# Patient Record
Sex: Female | Born: 1975
Health system: Southern US, Community
[De-identification: ages and names within clinical notes are randomized; demographics above are authoritative.]

## PROBLEM LIST (undated history)

## (undated) DIAGNOSIS — Z8489 Family history of other specified conditions: Secondary | ICD-10-CM

## (undated) DIAGNOSIS — D219 Benign neoplasm of connective and other soft tissue, unspecified: Secondary | ICD-10-CM

## (undated) DIAGNOSIS — R519 Headache, unspecified: Secondary | ICD-10-CM

## (undated) DIAGNOSIS — Z9889 Other specified postprocedural states: Secondary | ICD-10-CM

## (undated) DIAGNOSIS — T8859XA Other complications of anesthesia, initial encounter: Secondary | ICD-10-CM

## (undated) DIAGNOSIS — M1990 Unspecified osteoarthritis, unspecified site: Secondary | ICD-10-CM

## (undated) DIAGNOSIS — R112 Nausea with vomiting, unspecified: Secondary | ICD-10-CM

## (undated) HISTORY — PX: INTRAUTERINE DEVICE (IUD) INSERTION: SHX5877

## (undated) HISTORY — PX: INGUINAL HERNIA REPAIR: SUR1180

## (undated) HISTORY — PX: OTHER SURGICAL HISTORY: SHX169

## (undated) HISTORY — DX: Benign neoplasm of connective and other soft tissue, unspecified: D21.9

## (undated) HISTORY — PX: ABDOMINAL HYSTERECTOMY: SHX81

## (undated) HISTORY — PX: TONSILLECTOMY: SUR1361

---

## 2012-10-03 ENCOUNTER — Encounter: Payer: Self-pay | Admitting: Family Medicine

## 2012-10-03 ENCOUNTER — Ambulatory Visit (INDEPENDENT_AMBULATORY_CARE_PROVIDER_SITE_OTHER): Payer: BC Managed Care – PPO | Admitting: Family Medicine

## 2012-10-03 VITALS — BP 110/70 | HR 69 | Temp 98.3°F | Ht 66.0 in | Wt 153.2 lb

## 2012-10-03 DIAGNOSIS — Z975 Presence of (intrauterine) contraceptive device: Secondary | ICD-10-CM | POA: Insufficient documentation

## 2012-10-03 DIAGNOSIS — N6019 Diffuse cystic mastopathy of unspecified breast: Secondary | ICD-10-CM

## 2012-10-03 DIAGNOSIS — N63 Unspecified lump in unspecified breast: Secondary | ICD-10-CM | POA: Insufficient documentation

## 2012-10-03 DIAGNOSIS — N644 Mastodynia: Secondary | ICD-10-CM | POA: Insufficient documentation

## 2012-10-03 NOTE — Patient Instructions (Addendum)
We'll call you with your Mammogram and GYN appts Start ibuprofen/motrin/advil for breast pain Call with any questions or concerns Schedule your complete physical at your convenience Welcome!  We're glad to have you!!

## 2012-10-03 NOTE — Assessment & Plan Note (Signed)
New.  Suspect fibrocystic breasts as pt reports they are tender w/ hormonal changes and will develop lumps that then regress.  But due to presence of breast lumps, will get diagnostic mammo to ensure benign nature.  Pt expressed understanding and is in agreement w/ plan.

## 2012-10-03 NOTE — Assessment & Plan Note (Signed)
New.  Refer to GYN for ongoing management

## 2012-10-03 NOTE — Progress Notes (Signed)
  Subjective:    Patient ID: Dawn Evans, female    DOB: 05-28-1976, 37 y.o.   MRN: [id number]  HPI New to establish.  Recently moved from [place].  Mirena- pt had insertion 2 yrs ago for menorrhagia, would like female GYN.  Breast swelling- will occur 1-2 weeks before period.  Will develop 2 small lumps in L breast laterally and 1 lump in R breast laterally and are painful to touch.  Lumps will regress in size after period.  No family hx of breast cancer.  Has always had breast swelling w/ menses but no pain until 1-2 yrs ago.  Pt feels this may be related to mirena insertion.   Review of Systems For ROS see HPI     Objective:   Physical Exam  Vitals reviewed. Constitutional: She is oriented to person, place, and time. She appears well-developed and well-nourished. No distress.  HENT:  Head: Normocephalic and atraumatic.  Eyes: Conjunctivae normal and EOM are normal. Pupils are equal, round, and reactive to light.  Neck: Normal range of motion. Neck supple. No thyromegaly present.  Cardiovascular: Normal rate, regular rhythm and normal heart sounds.   Pulmonary/Chest: Effort normal and breath sounds normal. No respiratory distress. She has no wheezes. She has no rales. Right breast exhibits tenderness (diffusely). Right breast exhibits no inverted nipple, no nipple discharge and no skin change. Left breast exhibits tenderness (diffusely). Left breast exhibits no inverted nipple, no nipple discharge and no skin change. Breasts are symmetrical.    Lymphadenopathy:    She has no cervical adenopathy.  Neurological: She is alert and oriented to person, place, and time.  Skin: Skin is warm and dry.  Psychiatric: She has a normal mood and affect. Her behavior is normal. Thought content normal.          Assessment & Plan:

## 2012-10-22 ENCOUNTER — Other Ambulatory Visit: Payer: Self-pay | Admitting: Family Medicine

## 2012-10-22 ENCOUNTER — Ambulatory Visit
Admission: RE | Admit: 2012-10-22 | Discharge: 2012-10-22 | Disposition: A | Discharge status: Home / Self Care | Payer: BC Managed Care – PPO | Source: Ambulatory Visit | Attending: Family Medicine | Admitting: Family Medicine

## 2012-10-22 DIAGNOSIS — N63 Unspecified lump in unspecified breast: Secondary | ICD-10-CM

## 2012-10-22 DIAGNOSIS — N644 Mastodynia: Secondary | ICD-10-CM

## 2012-11-19 LAB — HM PAP SMEAR

## 2014-09-04 HISTORY — PX: KNEE SURGERY: SHX244

## 2014-11-16 ENCOUNTER — Encounter: Payer: Self-pay | Admitting: Family Medicine

## 2014-11-16 ENCOUNTER — Ambulatory Visit (INDEPENDENT_AMBULATORY_CARE_PROVIDER_SITE_OTHER): Payer: BLUE CROSS/BLUE SHIELD | Admitting: Family Medicine

## 2014-11-16 VITALS — BP 120/80 | HR 58 | Temp 98.5°F | Resp 16 | Wt 153.4 lb

## 2014-11-16 DIAGNOSIS — J302 Other seasonal allergic rhinitis: Secondary | ICD-10-CM

## 2014-11-16 DIAGNOSIS — J310 Chronic rhinitis: Secondary | ICD-10-CM | POA: Insufficient documentation

## 2014-11-16 DIAGNOSIS — M25561 Pain in right knee: Secondary | ICD-10-CM

## 2014-11-16 MED ORDER — MELOXICAM 15 MG PO TABS: 15.0000 mg | ORAL_TABLET | Freq: Every day | ORAL | Status: DC

## 2014-11-16 NOTE — Assessment & Plan Note (Signed)
New.  Pt's sxs are consistent w/ seasonal allergies.  Interested in testing to identify her triggers.  Will get blood test today.  Cautioned pt that this is not as accurate as skin testing but she wants to proceed rather than see allergist.  Start OTC antihistamine daily.  Reviewed supportive care and red flags that should prompt return.  Pt expressed understanding and is in agreement w/ plan.

## 2014-11-16 NOTE — Progress Notes (Signed)
   Subjective:    Patient ID: Dawn Evans, female    DOB: 1976-07-16, 39 y.o.   MRN: [id number]  HPI R knee- pt has hx of torn meniscus. 3 weeks ago was sitting on floor and when she got up, knee was locked and she was unable to walk x3 days.  No external swelling but 'it felt swollen inside the knee'.  No bruising.  Pt continues to have locking and clicking.  Pain is worse after exercise.  Pain is localized to medial knee.  Difficult to go up and down stairs.  Seasonal allergies- taking Claritin OTC w/o relief.  Pt is asking for allergy testing to determine cause.   Review of Systems For ROS see HPI     Objective:   Physical Exam  Constitutional: She appears well-developed and well-nourished. No distress.  HENT:  Head: Normocephalic and atraumatic.  Right Ear: Tympanic membrane normal.  Left Ear: Tympanic membrane normal.  Nose: Mucosal edema and rhinorrhea present. Right sinus exhibits no maxillary sinus tenderness and no frontal sinus tenderness. Left sinus exhibits no maxillary sinus tenderness and no frontal sinus tenderness.  Mouth/Throat: Mucous membranes are normal. Posterior oropharyngeal erythema (w/ PND) present.  Eyes: Conjunctivae and EOM are normal. Pupils are equal, round, and reactive to light.  Neck: Normal range of motion. Neck supple.  Cardiovascular: Normal rate, regular rhythm and normal heart sounds.   Pulmonary/Chest: Effort normal and breath sounds normal. No respiratory distress. She has no wheezes. She has no rales.  Musculoskeletal: She exhibits tenderness (TTP over R medial knee, no crepitus w/ flexion/extension). She exhibits no edema.  Lymphadenopathy:    She has no cervical adenopathy.  Vitals reviewed.         Assessment & Plan:

## 2014-11-16 NOTE — Patient Instructions (Signed)
Schedule your complete physical at your convenience Start the Mobic once daily- take w/ food We'll call you with your orthopedic appt Call with any questions or concerns Hang in there!!! Enjoy spring!!!

## 2014-11-16 NOTE — Assessment & Plan Note (Signed)
New.  Pt has hx of torn meniscus.  sxs are again sounding like a meniscal injury- locking, clicking, difficulty w/ stairs.  Start scheduled NSAIDs.  Refer to ortho for complete evaluation and tx.  Reviewed supportive care and red flags that should prompt return.  Pt expressed understanding and is in agreement w/ plan.

## 2014-11-16 NOTE — Progress Notes (Signed)
Pre visit review using our clinic review tool, if applicable. No additional management support is needed unless otherwise documented below in the visit note. 

## 2014-11-17 ENCOUNTER — Encounter: Payer: Self-pay | Admitting: General Practice

## 2014-11-17 LAB — ~~LOC~~ ALLERGY PANEL
Allergen, Comm Silver Birch, t9: <0.1 kU/L
Alternaria Alternata: <0.1 kU/L
Aspergillus fumigatus, m3: <0.1 kU/L
Bahia Grass: <0.1 kU/L
Bermuda Grass: <0.1 kU/L
Box Elder IgE: <0.1 kU/L
Cat Dander: <0.1 kU/L
Cockroach: <0.1 kU/L
Common Ragweed: <0.1 kU/L
Dog Dander: <0.1 kU/L
Elm IgE: <0.1 kU/L
Johnson Grass: <0.1 kU/L
Mucor Racemosus: <0.1 kU/L
Mugwort: <0.1 kU/L
PENICILLIUM NOTATUM: 0.29 kU/L — AB
Pecan/Hickory Tree IgE: <0.1 kU/L
Rough Pigweed  IgE: <0.1 kU/L
Sweet Gum: <0.1 kU/L

## 2015-04-15 ENCOUNTER — Telehealth: Payer: Self-pay | Admitting: Behavioral Health

## 2015-04-15 ENCOUNTER — Encounter: Payer: Self-pay | Admitting: Behavioral Health

## 2015-04-15 NOTE — Telephone Encounter (Signed)
Pre-Visit Call completed with patient and chart updated.   Pre-Visit Info documented in Specialty Comments under [employer].    

## 2015-04-16 ENCOUNTER — Ambulatory Visit (INDEPENDENT_AMBULATORY_CARE_PROVIDER_SITE_OTHER): Payer: BLUE CROSS/BLUE SHIELD | Admitting: Family Medicine

## 2015-04-16 ENCOUNTER — Encounter: Payer: Self-pay | Admitting: Family Medicine

## 2015-04-16 ENCOUNTER — Encounter: Payer: Self-pay | Admitting: General Practice

## 2015-04-16 VITALS — BP 116/78 | HR 67 | Temp 98.0°F | Resp 16 | Ht 66.0 in | Wt 149.4 lb

## 2015-04-16 DIAGNOSIS — Z0000 Encounter for general adult medical examination without abnormal findings: Secondary | ICD-10-CM | POA: Diagnosis not present

## 2015-04-16 LAB — LIPID PANEL
CHOLESTEROL: 144 mg/dL (ref 0–200)
HDL: 53.4 mg/dL (ref >39.00)
LDL Cholesterol: 79 mg/dL (ref 0–99)
NonHDL: 90.44
TRIGLYCERIDES: 56 mg/dL (ref 0.0–149.0)
Total CHOL/HDL Ratio: 3
VLDL: 11.2 mg/dL (ref 0.0–40.0)

## 2015-04-16 LAB — BASIC METABOLIC PANEL
BUN: 9 mg/dL (ref 6–23)
CALCIUM: 9.3 mg/dL (ref 8.4–10.5)
CHLORIDE: 104 meq/L (ref 96–112)
CO2: 26 mEq/L (ref 19–32)
Creatinine, Ser: 0.7 mg/dL (ref 0.40–1.20)
GFR: 99.25 mL/min (ref >60.00)
GLUCOSE: 86 mg/dL (ref 70–99)
Potassium: 3.9 mEq/L (ref 3.5–5.1)
Sodium: 136 mEq/L (ref 135–145)

## 2015-04-16 LAB — HEPATIC FUNCTION PANEL
ALBUMIN: 4.3 g/dL (ref 3.5–5.2)
ALT: 10 U/L (ref 0–35)
AST: 15 U/L (ref 0–37)
Alkaline Phosphatase: 38 U/L — ABNORMAL LOW (ref 39–117)
BILIRUBIN DIRECT: 0.1 mg/dL (ref 0.0–0.3)
TOTAL PROTEIN: 7.3 g/dL (ref 6.0–8.3)
Total Bilirubin: 0.5 mg/dL (ref 0.2–1.2)

## 2015-04-16 LAB — CBC WITH DIFFERENTIAL/PLATELET
BASOS ABS: 0 10*3/uL (ref 0.0–0.1)
Basophils Relative: 0.4 % (ref 0.0–3.0)
Eosinophils Absolute: 0.2 10*3/uL (ref 0.0–0.7)
Eosinophils Relative: 2.2 % (ref 0.0–5.0)
HCT: 41.5 % (ref 36.0–46.0)
HEMOGLOBIN: 13.7 g/dL (ref 12.0–15.0)
Lymphocytes Relative: 21.3 % (ref 12.0–46.0)
Lymphs Abs: 1.7 10*3/uL (ref 0.7–4.0)
MCHC: 33 g/dL (ref 30.0–36.0)
MCV: 90.2 fl (ref 78.0–100.0)
Monocytes Absolute: 0.3 10*3/uL (ref 0.1–1.0)
Monocytes Relative: 4.3 % (ref 3.0–12.0)
Neutro Abs: 5.7 10*3/uL (ref 1.4–7.7)
Neutrophils Relative %: 71.8 % (ref 43.0–77.0)
Platelets: 231 10*3/uL (ref 150.0–400.0)
RBC: 4.61 Mil/uL (ref 3.87–5.11)
RDW: 13.2 % (ref 11.5–15.5)
WBC: 7.9 10*3/uL (ref 4.0–10.5)

## 2015-04-16 LAB — VITAMIN D 25 HYDROXY (VIT D DEFICIENCY, FRACTURES): VITD: 29.4 ng/mL — ABNORMAL LOW (ref 30.00–100.00)

## 2015-04-16 LAB — TSH: TSH: 1.27 u[IU]/mL (ref 0.35–4.50)

## 2015-04-16 NOTE — Patient Instructions (Signed)
Follow up in 1 year or as needed We'll notify you of your lab results and make any changes if needed Keep up the good work!  You look great!!! Call with any questions or concerns Enjoy the rest of your summer!! 

## 2015-04-16 NOTE — Progress Notes (Signed)
   Subjective:    Patient ID: Dawn Evans, female    DOB: 09-Aug-1976, 39 y.o.   MRN: [id number]  HPI CPE- UTD on GYN [place], pt plans to schedule.   Review of Systems Patient reports no vision/ hearing changes, adenopathy,fever, weight change,  persistant/recurrent hoarseness , swallowing issues, chest pain, palpitations, edema, persistant/recurrent cough, hemoptysis, dyspnea (rest/exertional/paroxysmal nocturnal), gastrointestinal bleeding (melena, rectal bleeding), abdominal pain, significant heartburn, bowel changes, GU symptoms (dysuria, hematuria, incontinence), Gyn symptoms (abnormal  bleeding, pain),  syncope, focal weakness, memory loss, numbness & tingling, skin/hair/nail changes, abnormal bruising or bleeding, anxiety, or depression.     Objective:   Physical Exam General Appearance:    Alert, cooperative, no distress, appears stated age  Head:    Normocephalic, without obvious abnormality, atraumatic  Eyes:    PERRL, conjunctiva/corneas clear, EOM's intact, fundi    benign, both eyes  Ears:    Normal TM's and external ear canals, both ears  Nose:   Nares normal, septum midline, mucosa normal, no drainage    or sinus tenderness  Throat:   Lips, mucosa, and tongue normal; teeth and gums normal  Neck:   Supple, symmetrical, trachea midline, no adenopathy;    Thyroid: no enlargement/tenderness/nodules  Back:     Symmetric, no curvature, ROM normal, no CVA tenderness  Lungs:     Clear to auscultation bilaterally, respirations unlabored  Chest Wall:    No tenderness or deformity   Heart:    Regular rate and rhythm, S1 and S2 normal, no murmur, rub   or gallop  Breast Exam:    Deferred to GYN  Abdomen:     Soft, non-tender, bowel sounds active all four quadrants,    no masses, no organomegaly  Genitalia:    Deferred to GYN  Rectal:    Extremities:   Extremities normal, atraumatic, no cyanosis or edema  Pulses:   2+ and symmetric all extremities  Skin:   Skin color, texture,  turgor normal, no rashes or lesions  Lymph nodes:   Cervical, supraclavicular, and axillary nodes normal  Neurologic:   CNII-XII intact, normal strength, sensation and reflexes    throughout          Assessment & Plan:

## 2015-04-16 NOTE — Progress Notes (Signed)
Pre visit review using our clinic review tool, if applicable. No additional management support is needed unless otherwise documented below in the visit note. 

## 2015-04-16 NOTE — Assessment & Plan Note (Signed)
Pt's PE WNL.  Pt to schedule appt w/ GYN.  Check labs.  Anticipatory guidance provided.

## 2015-08-19 LAB — HM PAP SMEAR

## 2015-11-02 ENCOUNTER — Telehealth: Payer: Self-pay | Admitting: Family Medicine

## 2015-11-02 NOTE — Telephone Encounter (Signed)
Chart updated to reflect 

## 2015-11-02 NOTE — Telephone Encounter (Signed)
Pt declined flu shot °

## 2016-04-18 ENCOUNTER — Ambulatory Visit (INDEPENDENT_AMBULATORY_CARE_PROVIDER_SITE_OTHER): Payer: BLUE CROSS/BLUE SHIELD | Admitting: Family Medicine

## 2016-04-18 ENCOUNTER — Encounter: Payer: Self-pay | Admitting: Family Medicine

## 2016-04-18 VITALS — BP 110/78 | HR 66 | Temp 98.1°F | Resp 16 | Ht 66.0 in | Wt 154.0 lb

## 2016-04-18 DIAGNOSIS — Z23 Encounter for immunization: Secondary | ICD-10-CM

## 2016-04-18 DIAGNOSIS — Z0000 Encounter for general adult medical examination without abnormal findings: Secondary | ICD-10-CM | POA: Diagnosis not present

## 2016-04-18 LAB — CBC WITH DIFFERENTIAL/PLATELET
BASOS ABS: 0 10*3/uL (ref 0.0–0.1)
Basophils Relative: 0.4 % (ref 0.0–3.0)
EOS PCT: 3.7 % (ref 0.0–5.0)
Eosinophils Absolute: 0.3 10*3/uL (ref 0.0–0.7)
HCT: 40.7 % (ref 36.0–46.0)
HEMOGLOBIN: 13.7 g/dL (ref 12.0–15.0)
Lymphocytes Relative: 22.9 % (ref 12.0–46.0)
Lymphs Abs: 1.8 10*3/uL (ref 0.7–4.0)
MCHC: 33.6 g/dL (ref 30.0–36.0)
MCV: 90.5 fl (ref 78.0–100.0)
MONO ABS: 0.3 10*3/uL (ref 0.1–1.0)
MONOS PCT: 4.3 % (ref 3.0–12.0)
Neutro Abs: 5.5 10*3/uL (ref 1.4–7.7)
Neutrophils Relative %: 68.7 % (ref 43.0–77.0)
Platelets: 255 10*3/uL (ref 150.0–400.0)
RBC: 4.5 Mil/uL (ref 3.87–5.11)
RDW: 13.5 % (ref 11.5–15.5)
WBC: 8 10*3/uL (ref 4.0–10.5)

## 2016-04-18 LAB — HEPATIC FUNCTION PANEL
ALBUMIN: 4.3 g/dL (ref 3.5–5.2)
ALT: 10 U/L (ref 0–35)
AST: 15 U/L (ref 0–37)
Alkaline Phosphatase: 36 U/L — ABNORMAL LOW (ref 39–117)
Bilirubin, Direct: 0.1 mg/dL (ref 0.0–0.3)
TOTAL PROTEIN: 6.8 g/dL (ref 6.0–8.3)
Total Bilirubin: 0.6 mg/dL (ref 0.2–1.2)

## 2016-04-18 LAB — LIPID PANEL
CHOLESTEROL: 150 mg/dL (ref 0–200)
HDL: 57.8 mg/dL (ref >39.00)
LDL Cholesterol: 74 mg/dL (ref 0–99)
NonHDL: 92.66
Total CHOL/HDL Ratio: 3
Triglycerides: 92 mg/dL (ref 0.0–149.0)
VLDL: 18.4 mg/dL (ref 0.0–40.0)

## 2016-04-18 LAB — VITAMIN D 25 HYDROXY (VIT D DEFICIENCY, FRACTURES): VITD: 23.3 ng/mL — AB (ref 30.00–100.00)

## 2016-04-18 LAB — BASIC METABOLIC PANEL
BUN: 9 mg/dL (ref 6–23)
CO2: 28 meq/L (ref 19–32)
Calcium: 9.4 mg/dL (ref 8.4–10.5)
Chloride: 104 mEq/L (ref 96–112)
Creatinine, Ser: 0.63 mg/dL (ref 0.40–1.20)
GFR: 111.5 mL/min (ref >60.00)
GLUCOSE: 76 mg/dL (ref 70–99)
POTASSIUM: 4 meq/L (ref 3.5–5.1)
Sodium: 138 mEq/L (ref 135–145)

## 2016-04-18 LAB — TSH: TSH: 2.19 u[IU]/mL (ref 0.35–4.50)

## 2016-04-18 NOTE — Progress Notes (Signed)
Pre visit review using our clinic review tool, if applicable. No additional management support is needed unless otherwise documented below in the visit note. 

## 2016-04-18 NOTE — Addendum Note (Signed)
Addended by: Midge Minium on: 04/18/2016 08:37 AM   Modules accepted: Orders

## 2016-04-18 NOTE — Patient Instructions (Signed)
Follow up in 1 year or as needed We'll notify you of your lab results and make any changes if needed Keep up the good work on healthy diet and regular exercise- you look great! Call with any questions or concerns Enjoy the rest of your summer!!! 

## 2016-04-18 NOTE — Assessment & Plan Note (Signed)
Pt's PE WNL.  UTD on GYN.  Due for Tdap.  Check labs.  Anticipatory guidance provided.

## 2016-04-18 NOTE — Addendum Note (Signed)
Addended by: Davis Gourd on: 04/18/2016 08:44 AM   Modules accepted: Orders

## 2016-04-18 NOTE — Progress Notes (Signed)
   Subjective:    Patient ID: Dawn Evans, female    DOB: [birth year], 40 y.o.   MRN: [id number]  HPI CPE- UTD on pap w/ Dr Dellis Filbert.  Due for Tdap.   Review of Systems Patient reports no vision/ hearing changes, adenopathy,fever, weight change,  persistant/recurrent hoarseness , swallowing issues, chest pain, palpitations, edema, persistant/recurrent cough, hemoptysis, dyspnea (rest/exertional/paroxysmal nocturnal), gastrointestinal bleeding (melena, rectal bleeding), abdominal pain, significant heartburn, bowel changes, GU symptoms (dysuria, hematuria, incontinence), Gyn symptoms (abnormal  bleeding, pain),  syncope, focal weakness, memory loss, numbness & tingling, skin/hair/nail changes, abnormal bruising or bleeding, anxiety, or depression.     Objective:   Physical Exam General Appearance:    Alert, cooperative, no distress, appears stated age  Head:    Normocephalic, without obvious abnormality, atraumatic  Eyes:    PERRL, conjunctiva/corneas clear, EOM's intact, fundi    benign, both eyes  Ears:    Normal TM's and external ear canals, both ears  Nose:   Nares normal, septum midline, mucosa normal, no drainage    or sinus tenderness  Throat:   Lips, mucosa, and tongue normal; teeth and gums normal  Neck:   Supple, symmetrical, trachea midline, no adenopathy;    Thyroid: no enlargement/tenderness/nodules  Back:     Symmetric, no curvature, ROM normal, no CVA tenderness  Lungs:     Clear to auscultation bilaterally, respirations unlabored  Chest Wall:    No tenderness or deformity   Heart:    Regular rate and rhythm, S1 and S2 normal, no murmur, rub   or gallop  Breast Exam:    Deferred to GYN  Abdomen:     Soft, non-tender, bowel sounds active all four quadrants,    no masses, no organomegaly  Genitalia:    Deferred to GYN  Rectal:    Extremities:   Extremities normal, atraumatic, no cyanosis or edema  Pulses:   2+ and symmetric all extremities  Skin:   Skin color, texture,  turgor normal, no rashes or lesions  Lymph nodes:   Cervical, supraclavicular, and axillary nodes normal  Neurologic:   CNII-XII intact, normal strength, sensation and reflexes    throughout          Assessment & Plan:

## 2016-04-19 ENCOUNTER — Other Ambulatory Visit: Payer: Self-pay | Admitting: General Practice

## 2016-04-19 MED ORDER — VITAMIN D (ERGOCALCIFEROL) 1.25 MG (50000 UNIT) PO CAPS: 50000.0000 [IU] | ORAL_CAPSULE | ORAL | 0 refills | Status: DC

## 2016-07-12 ENCOUNTER — Other Ambulatory Visit: Payer: Self-pay | Admitting: Family Medicine

## 2016-07-17 ENCOUNTER — Telehealth: Payer: Self-pay | Admitting: Family Medicine

## 2016-07-17 NOTE — Telephone Encounter (Signed)
Called pt and advised that there are no refills available, and she should continue OTC vitamin D 2000iu daily.

## 2016-07-17 NOTE — Telephone Encounter (Signed)
Pt calling asking for a vit D refill. Please advise.

## 2016-09-25 ENCOUNTER — Other Ambulatory Visit: Payer: Self-pay | Admitting: Family Medicine

## 2016-09-25 DIAGNOSIS — Z1231 Encounter for screening mammogram for malignant neoplasm of breast: Secondary | ICD-10-CM

## 2016-10-10 ENCOUNTER — Encounter: Payer: Self-pay | Admitting: Family Medicine

## 2016-10-24 ENCOUNTER — Ambulatory Visit
Admission: RE | Admit: 2016-10-24 | Discharge: 2016-10-24 | Disposition: A | Discharge status: Home / Self Care | Payer: BLUE CROSS/BLUE SHIELD | Source: Ambulatory Visit | Attending: Family Medicine | Admitting: Family Medicine

## 2016-10-24 ENCOUNTER — Encounter: Payer: Self-pay | Admitting: General Practice

## 2016-10-24 DIAGNOSIS — Z1231 Encounter for screening mammogram for malignant neoplasm of breast: Secondary | ICD-10-CM | POA: Diagnosis not present

## 2016-11-09 ENCOUNTER — Encounter: Payer: Self-pay | Admitting: Family Medicine

## 2016-11-17 DIAGNOSIS — Z6825 Body mass index (BMI) 25.0-25.9, adult: Secondary | ICD-10-CM | POA: Diagnosis not present

## 2016-11-17 DIAGNOSIS — Z01419 Encounter for gynecological examination (general) (routine) without abnormal findings: Secondary | ICD-10-CM | POA: Diagnosis not present

## 2017-01-13 ENCOUNTER — Encounter: Payer: Self-pay | Admitting: Family Medicine

## 2017-03-13 ENCOUNTER — Telehealth: Payer: Self-pay | Admitting: *Deleted

## 2017-03-13 MED ORDER — NORETHIN ACE-ETH ESTRAD-FE 1-20 MG-MCG(24) PO TABS: 1.0000 | ORAL_TABLET | Freq: Every day | ORAL | 0 refills | Status: DC

## 2017-03-13 NOTE — Telephone Encounter (Signed)
Pt informed, Rx sent. 

## 2017-03-13 NOTE — Telephone Encounter (Signed)
Can try Lomedia Fe 1/20 (24) to start now, continuous use x 2 packs.

## 2017-03-13 NOTE — Telephone Encounter (Signed)
Pt called stating her cycle started early this month and due to start again at the end of this month, has Mirena IUD, will be leaving for vacation for [place] and would like to delay her cycle, asked if Rx could be prescribed to do so? Please advise

## 2017-04-17 DIAGNOSIS — S0501XA Injury of conjunctiva and corneal abrasion without foreign body, right eye, initial encounter: Secondary | ICD-10-CM | POA: Diagnosis not present

## 2017-04-20 ENCOUNTER — Encounter: Payer: Self-pay | Admitting: General Practice

## 2017-04-20 ENCOUNTER — Encounter: Payer: Self-pay | Admitting: Family Medicine

## 2017-04-20 ENCOUNTER — Ambulatory Visit (INDEPENDENT_AMBULATORY_CARE_PROVIDER_SITE_OTHER): Payer: BLUE CROSS/BLUE SHIELD | Admitting: Family Medicine

## 2017-04-20 VITALS — BP 112/82 | HR 76 | Temp 98.0°F | Resp 16 | Ht 66.0 in | Wt 152.2 lb

## 2017-04-20 DIAGNOSIS — E559 Vitamin D deficiency, unspecified: Secondary | ICD-10-CM | POA: Diagnosis not present

## 2017-04-20 DIAGNOSIS — Z0000 Encounter for general adult medical examination without abnormal findings: Secondary | ICD-10-CM | POA: Diagnosis not present

## 2017-04-20 DIAGNOSIS — M25561 Pain in right knee: Secondary | ICD-10-CM

## 2017-04-20 LAB — HEPATIC FUNCTION PANEL
ALT: 11 U/L (ref 0–35)
AST: 17 U/L (ref 0–37)
Albumin: 4 g/dL (ref 3.5–5.2)
Alkaline Phosphatase: 37 U/L — ABNORMAL LOW (ref 39–117)
BILIRUBIN TOTAL: 0.5 mg/dL (ref 0.2–1.2)
Bilirubin, Direct: 0.1 mg/dL (ref 0.0–0.3)
Total Protein: 6.5 g/dL (ref 6.0–8.3)

## 2017-04-20 LAB — CBC WITH DIFFERENTIAL/PLATELET
BASOS PCT: 0.8 % (ref 0.0–3.0)
Basophils Absolute: 0.1 10*3/uL (ref 0.0–0.1)
EOS PCT: 4.3 % (ref 0.0–5.0)
Eosinophils Absolute: 0.4 10*3/uL (ref 0.0–0.7)
HEMATOCRIT: 42.2 % (ref 36.0–46.0)
HEMOGLOBIN: 13.9 g/dL (ref 12.0–15.0)
LYMPHS PCT: 24.1 % (ref 12.0–46.0)
Lymphs Abs: 2 10*3/uL (ref 0.7–4.0)
MCHC: 32.9 g/dL (ref 30.0–36.0)
MCV: 92.4 fl (ref 78.0–100.0)
Monocytes Absolute: 0.5 10*3/uL (ref 0.1–1.0)
Monocytes Relative: 6.5 % (ref 3.0–12.0)
Neutro Abs: 5.4 10*3/uL (ref 1.4–7.7)
Neutrophils Relative %: 64.3 % (ref 43.0–77.0)
Platelets: 243 10*3/uL (ref 150.0–400.0)
RBC: 4.57 Mil/uL (ref 3.87–5.11)
RDW: 13.5 % (ref 11.5–15.5)
WBC: 8.4 10*3/uL (ref 4.0–10.5)

## 2017-04-20 LAB — BASIC METABOLIC PANEL
BUN: 11 mg/dL (ref 6–23)
CALCIUM: 9.1 mg/dL (ref 8.4–10.5)
CO2: 30 mEq/L (ref 19–32)
CREATININE: 0.7 mg/dL (ref 0.40–1.20)
Chloride: 104 mEq/L (ref 96–112)
GFR: 98.23 mL/min (ref >60.00)
Glucose, Bld: 86 mg/dL (ref 70–99)
Potassium: 3.9 mEq/L (ref 3.5–5.1)
Sodium: 138 mEq/L (ref 135–145)

## 2017-04-20 LAB — LIPID PANEL
CHOL/HDL RATIO: 3
Cholesterol: 141 mg/dL (ref 0–200)
HDL: 48 mg/dL (ref >39.00)
LDL CALC: 81 mg/dL (ref 0–99)
NonHDL: 93.47
TRIGLYCERIDES: 64 mg/dL (ref 0.0–149.0)
VLDL: 12.8 mg/dL (ref 0.0–40.0)

## 2017-04-20 LAB — VITAMIN D 25 HYDROXY (VIT D DEFICIENCY, FRACTURES): VITD: 43.38 ng/mL (ref 30.00–100.00)

## 2017-04-20 NOTE — Assessment & Plan Note (Signed)
Pt's knee pain continues despite surgery 2 yrs ago.  Pt asking for 2nd opinion.  Referral placed.

## 2017-04-20 NOTE — Progress Notes (Signed)
   Subjective:    Patient ID: Dawn Evans, female    DOB: 1976-06-13, 41 y.o.   MRN: [id number]  HPI CPE- UTD on GYN.  UTD on Tdap.   Review of Systems Patient reports no vision/ hearing changes, adenopathy,fever, weight change,  persistant/recurrent hoarseness , swallowing issues, chest pain, palpitations, edema, persistant/recurrent cough, hemoptysis, dyspnea (rest/exertional/paroxysmal nocturnal), gastrointestinal bleeding (melena, rectal bleeding), abdominal pain, significant heartburn, bowel changes, GU symptoms (dysuria, hematuria, incontinence), Gyn symptoms (abnormal  bleeding, pain),  syncope, focal weakness, memory loss, numbness & tingling, skin/hair/nail changes, abnormal bruising or bleeding, anxiety, or depression.   + R knee pain- surgery 2 yrs ago w/ Dr Gladstone Lighter, asking for 2nd opinion due to continued pain    Objective:   Physical Exam General Appearance:    Alert, cooperative, no distress, appears stated age  Head:    Normocephalic, without obvious abnormality, atraumatic  Eyes:    PERRL, conjunctiva/corneas clear, EOM's intact, fundi    benign, both eyes  Ears:    Normal TM's and external ear canals, both ears  Nose:   Nares normal, septum midline, mucosa normal, no drainage    or sinus tenderness  Throat:   Lips, mucosa, and tongue normal; teeth and gums normal  Neck:   Supple, symmetrical, trachea midline, no adenopathy;    Thyroid: no enlargement/tenderness/nodules  Back:     Symmetric, no curvature, ROM normal, no CVA tenderness  Lungs:     Clear to auscultation bilaterally, respirations unlabored  Chest Wall:    No tenderness or deformity   Heart:    Regular rate and rhythm, S1 and S2 normal, no murmur, rub   or gallop  Breast Exam:    Deferred to GYN  Abdomen:     Soft, non-tender, bowel sounds active all four quadrants,    no masses, no organomegaly  Genitalia:    Deferred to GYN  Rectal:    Extremities:   Extremities normal, atraumatic, no cyanosis or  edema  Pulses:   2+ and symmetric all extremities  Skin:   Skin color, texture, turgor normal, no rashes or lesions  Lymph nodes:   Cervical, supraclavicular, and axillary nodes normal  Neurologic:   CNII-XII intact, normal strength, sensation and reflexes    throughout          Assessment & Plan:

## 2017-04-20 NOTE — Assessment & Plan Note (Signed)
Check labs.  Replete prn. 

## 2017-04-20 NOTE — Assessment & Plan Note (Signed)
Pt's PE WNL.  UTD on GYN, Tdap.  Check labs.  Anticipatory guidance provided.  

## 2017-04-20 NOTE — Progress Notes (Signed)
Pre visit review using our clinic review tool, if applicable. No additional management support is needed unless otherwise documented below in the visit note. 

## 2017-04-20 NOTE — Patient Instructions (Signed)
Follow up in 1 year or as needed We'll notify you of your lab results and make any changes if needed Keep up the good work on healthy diet and regular exercise- you look great!!! We'll call you with your Ortho appt Raliegh Ip) for the 2nd opinion Call with any questions or concerns Good luck with back to school!

## 2017-05-01 DIAGNOSIS — S83241A Other tear of medial meniscus, current injury, right knee, initial encounter: Secondary | ICD-10-CM | POA: Diagnosis not present

## 2017-05-10 DIAGNOSIS — M25561 Pain in right knee: Secondary | ICD-10-CM | POA: Diagnosis not present

## 2017-05-17 DIAGNOSIS — M1711 Unilateral primary osteoarthritis, right knee: Secondary | ICD-10-CM | POA: Diagnosis not present

## 2017-06-12 DIAGNOSIS — M1711 Unilateral primary osteoarthritis, right knee: Secondary | ICD-10-CM | POA: Diagnosis not present

## 2017-06-19 DIAGNOSIS — M1711 Unilateral primary osteoarthritis, right knee: Secondary | ICD-10-CM | POA: Diagnosis not present

## 2017-06-22 DIAGNOSIS — S61205A Unspecified open wound of left ring finger without damage to nail, initial encounter: Secondary | ICD-10-CM | POA: Diagnosis not present

## 2017-06-22 DIAGNOSIS — W268XXA Contact with other sharp object(s), not elsewhere classified, initial encounter: Secondary | ICD-10-CM | POA: Diagnosis not present

## 2017-06-26 DIAGNOSIS — M1711 Unilateral primary osteoarthritis, right knee: Secondary | ICD-10-CM | POA: Diagnosis not present

## 2017-07-02 ENCOUNTER — Ambulatory Visit (INDEPENDENT_AMBULATORY_CARE_PROVIDER_SITE_OTHER): Payer: BLUE CROSS/BLUE SHIELD | Admitting: Physician Assistant

## 2017-07-02 ENCOUNTER — Encounter: Payer: Self-pay | Admitting: Physician Assistant

## 2017-07-02 VITALS — BP 120/78 | HR 64 | Temp 97.7°F | Resp 14 | Ht 66.0 in | Wt 150.0 lb

## 2017-07-02 DIAGNOSIS — Z4802 Encounter for removal of sutures: Secondary | ICD-10-CM

## 2017-07-02 NOTE — Progress Notes (Signed)
Patient presents to clinic today for future removal. Patient presented to UC on 06/22/17 after sustaining a laceration to the palmar surface of third left phalanx    . Laceration was cleaned, explored and sutured with 3 simple, interrupted sutures. Patient has been keeping area clean and dry since then. Denies fever, pain, drainage from site. Is keeping covered. Notes some mild tingling in the finger since laceration.   History reviewed. No pertinent past medical history.  No current outpatient prescriptions on file prior to visit.   No current facility-administered medications on file prior to visit.     Allergies  Allergen Reactions  . Avocado Other (See Comments)    Stomach pains  . Almond Meal Rash  . Peanuts [Peanut Oil] Rash    Family History  Problem Relation Age of Onset  . Diabetes Mother   . Diabetes Maternal Uncle   . Diabetes Maternal Grandmother   . Hypertension Maternal Grandmother     Social History   Social History  . Marital status: Married    Spouse name: N/A  . Number of children: N/A  . Years of education: N/A   Social History Main Topics  . Smoking status: Never Smoker  . Smokeless tobacco: Never Used  . Alcohol use Yes     Comment: social  . Drug use: No  . Sexual activity: Not Asked   Other Topics Concern  . None   Social History Narrative  . None   Review of Systems - See HPI.  All other ROS are negative.  BP 120/78   Pulse 64   Temp 97.7 F (36.5 C) (Oral)   Resp 14   Ht 5\' 6"  (1.676 m)   Wt 150 lb (68 kg)   SpO2 98%   BMI 24.21 kg/m   Physical Exam  Constitutional: She is oriented to person, place, and time and well-developed, well-nourished, and in no distress.  HENT:  Head: Normocephalic and atraumatic.  Eyes: Conjunctivae are normal.  Neck: Neck supple.  Cardiovascular: Normal rate and regular rhythm.   Pulmonary/Chest: Effort normal and breath sounds normal. No respiratory distress. She has no wheezes. She has no  rales. She exhibits no tenderness.  Neurological: She is alert and oriented to person, place, and time.  Skin: Skin is warm and dry.  Psychiatric: Affect normal.  Vitals reviewed.   Recent Results (from the past 2160 hour(s))  Lipid panel     Status: None   Collection Time: 04/20/17  8:39 AM  Result Value Ref Range   Cholesterol 141 0 - 200 mg/dL    Comment: ATP III Classification       Desirable:  < 200 mg/dL               Borderline High:  200 - 239 mg/dL          High:  > = 240 mg/dL   Triglycerides 64.0 0.0 - 149.0 mg/dL    Comment: Normal:  <150 mg/dLBorderline High:  150 - 199 mg/dL   HDL 48.00 >39.00 mg/dL   VLDL 12.8 0.0 - 40.0 mg/dL   LDL Cholesterol 81 0 - 99 mg/dL   Total CHOL/HDL Ratio 3     Comment:                Men          Women1/2 Average Risk     3.4          3.3Average Risk  5.0          4.42X Average Risk          9.6          7.13X Average Risk          15.0          11.0                       NonHDL 93.47     Comment: NOTE:  Non-HDL goal should be 30 mg/dL higher than patient's LDL goal (i.e. LDL goal of < 70 mg/dL, would have non-HDL goal of < 100 mg/dL)  Basic metabolic panel     Status: None   Collection Time: 04/20/17  8:39 AM  Result Value Ref Range   Sodium 138 135 - 145 mEq/L   Potassium 3.9 3.5 - 5.1 mEq/L   Chloride 104 96 - 112 mEq/L   CO2 30 19 - 32 mEq/L   Glucose, Bld 86 70 - 99 mg/dL   BUN 11 6 - 23 mg/dL   Creatinine, Ser 0.70 0.40 - 1.20 mg/dL   Calcium 9.1 8.4 - 10.5 mg/dL   GFR 98.23 >60.00 mL/min  Hepatic function panel     Status: Abnormal   Collection Time: 04/20/17  8:39 AM  Result Value Ref Range   Total Bilirubin 0.5 0.2 - 1.2 mg/dL   Bilirubin, Direct 0.1 0.0 - 0.3 mg/dL   Alkaline Phosphatase 37 (L) 39 - 117 U/L   AST 17 0 - 37 U/L   ALT 11 0 - 35 U/L   Total Protein 6.5 6.0 - 8.3 g/dL   Albumin 4.0 3.5 - 5.2 g/dL  CBC with Differential/Platelet     Status: None   Collection Time: 04/20/17  8:39 AM  Result Value  Ref Range   WBC 8.4 4.0 - 10.5 K/uL   RBC 4.57 3.87 - 5.11 Mil/uL   Hemoglobin 13.9 12.0 - 15.0 g/dL   HCT 42.2 36.0 - 46.0 %   MCV 92.4 78.0 - 100.0 fl   MCHC 32.9 30.0 - 36.0 g/dL   RDW 13.5 11.5 - 15.5 %   Platelets 243.0 150.0 - 400.0 K/uL   Neutrophils Relative % 64.3 43.0 - 77.0 %   Lymphocytes Relative 24.1 12.0 - 46.0 %   Monocytes Relative 6.5 3.0 - 12.0 %   Eosinophils Relative 4.3 0.0 - 5.0 %   Basophils Relative 0.8 0.0 - 3.0 %   Neutro Abs 5.4 1.4 - 7.7 K/uL   Lymphs Abs 2.0 0.7 - 4.0 K/uL   Monocytes Absolute 0.5 0.1 - 1.0 K/uL   Eosinophils Absolute 0.4 0.0 - 0.7 K/uL   Basophils Absolute 0.1 0.0 - 0.1 K/uL  VITAMIN D 25 Hydroxy (Vit-D Deficiency, Fractures)     Status: None   Collection Time: 04/20/17  8:39 AM  Result Value Ref Range   VITD 43.38 30.00 - 100.00 ng/mL    Assessment/Plan: 1. Visit for suture removal 2 cm laceration with routine healing. 3 simple stitches removed with scissors. No sign of infection. Wound care discussed. Follow-up PRN.   Leeanne Rio, PA-C

## 2017-07-02 NOTE — Patient Instructions (Signed)
Please keep skin clean and dry. You can shower. No submerging under water. Keep dressing clean and dry. Continue bacitracin ointment  (very slight amount) over the next 2 days. Then you can stop that part.  There will likely be a small scar but will not be very visible.   If you note any pain, redness, drainage, please call or return to office.

## 2017-07-02 NOTE — Progress Notes (Signed)
Pre visit review using our clinic review tool, if applicable. No additional management support is needed unless otherwise documented below in the visit note. 

## 2017-09-21 ENCOUNTER — Other Ambulatory Visit: Payer: Self-pay | Admitting: Family Medicine

## 2017-09-21 DIAGNOSIS — Z1231 Encounter for screening mammogram for malignant neoplasm of breast: Secondary | ICD-10-CM

## 2017-09-25 DIAGNOSIS — M1711 Unilateral primary osteoarthritis, right knee: Secondary | ICD-10-CM | POA: Diagnosis not present

## 2017-10-22 ENCOUNTER — Encounter: Payer: Self-pay | Admitting: General Practice

## 2017-10-25 ENCOUNTER — Ambulatory Visit
Admission: RE | Admit: 2017-10-25 | Discharge: 2017-10-25 | Disposition: A | Discharge status: Home / Self Care | Payer: BLUE CROSS/BLUE SHIELD | Source: Ambulatory Visit | Attending: Family Medicine | Admitting: Family Medicine

## 2017-10-25 DIAGNOSIS — Z1231 Encounter for screening mammogram for malignant neoplasm of breast: Secondary | ICD-10-CM | POA: Diagnosis not present

## 2018-01-15 ENCOUNTER — Encounter: Payer: Self-pay | Admitting: Family Medicine

## 2018-01-30 ENCOUNTER — Telehealth: Payer: Self-pay | Admitting: *Deleted

## 2018-01-30 MED ORDER — NORETHIN ACE-ETH ESTRAD-FE 1-20 MG-MCG(24) PO TABS: 1.0000 | ORAL_TABLET | Freq: Every day | ORAL | 0 refills | Status: DC

## 2018-01-30 NOTE — Telephone Encounter (Signed)
Rx sent. Pt informed,

## 2018-01-30 NOTE — Telephone Encounter (Signed)
Agree with Lomedia Fe 1/20 (24) prescription for 3 packs.

## 2018-01-30 NOTE — Telephone Encounter (Signed)
Patient has Mirena IUD states she has monthly bleeding with IUD, will leaving for vacation soon and would like Rx for Lomedia FE 1/20 to help delay cycle? You approved this same request in July 2018. Patient annual exam scheduled on 05/01/18. Please advise

## 2018-03-06 ENCOUNTER — Encounter: Payer: Self-pay | Admitting: Family Medicine

## 2018-04-12 ENCOUNTER — Encounter: Payer: Self-pay | Admitting: Family Medicine

## 2018-04-12 DIAGNOSIS — E559 Vitamin D deficiency, unspecified: Secondary | ICD-10-CM

## 2018-04-12 DIAGNOSIS — Z0000 Encounter for general adult medical examination without abnormal findings: Secondary | ICD-10-CM

## 2018-04-15 ENCOUNTER — Encounter: Payer: Self-pay | Admitting: General Practice

## 2018-04-15 ENCOUNTER — Other Ambulatory Visit (INDEPENDENT_AMBULATORY_CARE_PROVIDER_SITE_OTHER): Payer: BLUE CROSS/BLUE SHIELD

## 2018-04-15 DIAGNOSIS — Z0000 Encounter for general adult medical examination without abnormal findings: Secondary | ICD-10-CM | POA: Diagnosis not present

## 2018-04-15 DIAGNOSIS — E559 Vitamin D deficiency, unspecified: Secondary | ICD-10-CM | POA: Diagnosis not present

## 2018-04-15 LAB — BASIC METABOLIC PANEL
BUN: 10 mg/dL (ref 6–23)
CALCIUM: 9.2 mg/dL (ref 8.4–10.5)
CO2: 30 meq/L (ref 19–32)
CREATININE: 0.79 mg/dL (ref 0.40–1.20)
Chloride: 106 mEq/L (ref 96–112)
GFR: 85.02 mL/min (ref >60.00)
Glucose, Bld: 86 mg/dL (ref 70–99)
Potassium: 3.8 mEq/L (ref 3.5–5.1)
Sodium: 140 mEq/L (ref 135–145)

## 2018-04-15 LAB — CBC WITH DIFFERENTIAL/PLATELET
BASOS PCT: 0.8 % (ref 0.0–3.0)
Basophils Absolute: 0 10*3/uL (ref 0.0–0.1)
EOS PCT: 4.3 % (ref 0.0–5.0)
Eosinophils Absolute: 0.2 10*3/uL (ref 0.0–0.7)
HCT: 42 % (ref 36.0–46.0)
Hemoglobin: 14.4 g/dL (ref 12.0–15.0)
LYMPHS ABS: 1.7 10*3/uL (ref 0.7–4.0)
Lymphocytes Relative: 32 % (ref 12.0–46.0)
MCHC: 34.2 g/dL (ref 30.0–36.0)
MCV: 89.8 fl (ref 78.0–100.0)
MONOS PCT: 5.1 % (ref 3.0–12.0)
Monocytes Absolute: 0.3 10*3/uL (ref 0.1–1.0)
NEUTROS ABS: 3.1 10*3/uL (ref 1.4–7.7)
NEUTROS PCT: 57.8 % (ref 43.0–77.0)
Platelets: 257 10*3/uL (ref 150.0–400.0)
RBC: 4.67 Mil/uL (ref 3.87–5.11)
RDW: 13.4 % (ref 11.5–15.5)
WBC: 5.4 10*3/uL (ref 4.0–10.5)

## 2018-04-15 LAB — LIPID PANEL
Cholesterol: 152 mg/dL (ref 0–200)
HDL: 52.3 mg/dL (ref >39.00)
LDL Cholesterol: 80 mg/dL (ref 0–99)
NONHDL: 99.7
Total CHOL/HDL Ratio: 3
Triglycerides: 98 mg/dL (ref 0.0–149.0)
VLDL: 19.6 mg/dL (ref 0.0–40.0)

## 2018-04-15 LAB — HEPATIC FUNCTION PANEL
ALBUMIN: 4.2 g/dL (ref 3.5–5.2)
ALK PHOS: 39 U/L (ref 39–117)
ALT: 9 U/L (ref 0–35)
AST: 13 U/L (ref 0–37)
BILIRUBIN DIRECT: 0.1 mg/dL (ref 0.0–0.3)
BILIRUBIN TOTAL: 0.6 mg/dL (ref 0.2–1.2)
Total Protein: 6.9 g/dL (ref 6.0–8.3)

## 2018-04-15 LAB — VITAMIN D 25 HYDROXY (VIT D DEFICIENCY, FRACTURES): VITD: 38.21 ng/mL (ref 30.00–100.00)

## 2018-04-23 ENCOUNTER — Ambulatory Visit (INDEPENDENT_AMBULATORY_CARE_PROVIDER_SITE_OTHER): Payer: BLUE CROSS/BLUE SHIELD | Admitting: Family Medicine

## 2018-04-23 ENCOUNTER — Encounter

## 2018-04-23 ENCOUNTER — Other Ambulatory Visit: Payer: Self-pay

## 2018-04-23 ENCOUNTER — Encounter: Payer: Self-pay | Admitting: Family Medicine

## 2018-04-23 VITALS — BP 121/80 | HR 69 | Temp 98.1°F | Resp 16 | Ht 67.0 in | Wt 154.1 lb

## 2018-04-23 DIAGNOSIS — Z0000 Encounter for general adult medical examination without abnormal findings: Secondary | ICD-10-CM | POA: Diagnosis not present

## 2018-04-23 NOTE — Assessment & Plan Note (Signed)
Pt's PE WNL.  Labs look great!  UTD on GYN, Tdap.  Anticipatory guidance provided.

## 2018-04-23 NOTE — Patient Instructions (Signed)
Follow up in 1 year or as needed Your labs look great!  No changes Keep up the good work on healthy diet and regular exercise- you look great! Call with any questions or concerns Enjoy Back To School!!

## 2018-04-23 NOTE — Progress Notes (Signed)
   Subjective:    Patient ID: Dawn Evans, female    DOB: 09-Sep-1975, 42 y.o.   MRN: [id number]  HPI CPE- UTD on Tdap, pt's recent labs WNL.  UTD on GYN   Review of Systems Patient reports no vision/ hearing changes, adenopathy,fever, weight change,  persistant/recurrent hoarseness , swallowing issues, chest pain, palpitations, edema, persistant/recurrent cough, hemoptysis, dyspnea (rest/exertional/paroxysmal nocturnal), gastrointestinal bleeding (melena, rectal bleeding), abdominal pain, significant heartburn, bowel changes, GU symptoms (dysuria, hematuria, incontinence), Gyn symptoms (abnormal  bleeding, pain),  syncope, focal weakness, memory loss, numbness & tingling, skin/hair/nail changes, abnormal bruising or bleeding, anxiety, or depression.     Objective:   Physical Exam General Appearance:    Alert, cooperative, no distress, appears stated age  Head:    Normocephalic, without obvious abnormality, atraumatic  Eyes:    PERRL, conjunctiva/corneas clear, EOM's intact, fundi    benign, both eyes  Ears:    Normal TM's and external ear canals, both ears  Nose:   Nares normal, septum midline, mucosa normal, no drainage    or sinus tenderness  Throat:   Lips, mucosa, and tongue normal; teeth and gums normal  Neck:   Supple, symmetrical, trachea midline, no adenopathy;    Thyroid: no enlargement/tenderness/nodules  Back:     Symmetric, no curvature, ROM normal, no CVA tenderness  Lungs:     Clear to auscultation bilaterally, respirations unlabored  Chest Wall:    No tenderness or deformity   Heart:    Regular rate and rhythm, S1 and S2 normal, no murmur, rub   or gallop  Breast Exam:    Deferred to GYN  Abdomen:     Soft, non-tender, bowel sounds active all four quadrants,    no masses, no organomegaly  Genitalia:    Deferred to GYN  Rectal:    Extremities:   Extremities normal, atraumatic, no cyanosis or edema  Pulses:   2+ and symmetric all extremities  Skin:   Skin color,  texture, turgor normal, no rashes or lesions  Lymph nodes:   Cervical, supraclavicular, and axillary nodes normal  Neurologic:   CNII-XII intact, normal strength, sensation and reflexes    throughout          Assessment & Plan:

## 2018-04-24 ENCOUNTER — Other Ambulatory Visit: Payer: Self-pay | Admitting: Obstetrics & Gynecology

## 2018-05-01 ENCOUNTER — Ambulatory Visit (INDEPENDENT_AMBULATORY_CARE_PROVIDER_SITE_OTHER): Payer: BLUE CROSS/BLUE SHIELD | Admitting: Obstetrics & Gynecology

## 2018-05-01 ENCOUNTER — Encounter: Payer: Self-pay | Admitting: Obstetrics & Gynecology

## 2018-05-01 VITALS — BP 116/78 | Ht 66.0 in | Wt 152.0 lb

## 2018-05-01 DIAGNOSIS — Z01419 Encounter for gynecological examination (general) (routine) without abnormal findings: Secondary | ICD-10-CM | POA: Diagnosis not present

## 2018-05-01 DIAGNOSIS — Z30431 Encounter for routine checking of intrauterine contraceptive device: Secondary | ICD-10-CM

## 2018-05-01 NOTE — Progress Notes (Signed)
Dawn Evans 01-06-1976 [id number]   History:    42 y.o. G2P2L2 Married.  Son in 6th grade, daughter in 5th grade  RP:  Established patient presenting for annual gyn exam   HPI: Well on Mirena IUD x 08/2015.  No breakthrough bleeding.  No pelvic pain.  No pain with intercourse.  Urine and bowel movements normal.  Breasts normal.  BMI 24.53.  Health labs with family physician.  Past medical history,surgical history, family history and social history were all reviewed and documented in the EPIC chart.  Gynecologic History Patient's last menstrual period was 04/12/2018. Contraception: Mirena IUD x 08/2015 Last Pap: 08/2015. Results were: Negative/HPV HR neg Last mammogram: 10/2017. Results were: Negative Bone Density: Never Colonoscopy: Never  Obstetric History OB History  Gravida Para Term Preterm AB Living  2 2       2   SAB TAB Ectopic Multiple Live Births               # Outcome Date GA Lbr Len/2nd Weight Sex Delivery Anes PTL Lv  2 Para           1 Para              ROS: A ROS was performed and pertinent positives and negatives are included in the history.  GENERAL: No fevers or chills. HEENT: No change in vision, no earache, sore throat or sinus congestion. NECK: No pain or stiffness. CARDIOVASCULAR: No chest pain or pressure. No palpitations. PULMONARY: No shortness of breath, cough or wheeze. GASTROINTESTINAL: No abdominal pain, nausea, vomiting or diarrhea, melena or bright red blood per rectum. GENITOURINARY: No urinary frequency, urgency, hesitancy or dysuria. MUSCULOSKELETAL: No joint or muscle pain, no back pain, no recent trauma. DERMATOLOGIC: No rash, no itching, no lesions. ENDOCRINE: No polyuria, polydipsia, no heat or cold intolerance. No recent change in weight. HEMATOLOGICAL: No anemia or easy bruising or bleeding. NEUROLOGIC: No headache, seizures, numbness, tingling or weakness. PSYCHIATRIC: No depression, no loss of interest in normal activity or change in  sleep pattern.     Exam:   BP 116/78   Ht 5\' 6"  (1.676 m)   Wt 152 lb (68.9 kg)   LMP 04/12/2018 Comment: mirena  08/2015  BMI 24.53 kg/m   Body mass index is 24.53 kg/m.  General appearance : Well developed well nourished female. No acute distress HEENT: Eyes: no retinal hemorrhage or exudates,  Neck supple, trachea midline, no carotid bruits, no thyroidmegaly Lungs: Clear to auscultation, no rhonchi or wheezes, or rib retractions  Heart: Regular rate and rhythm, no murmurs or gallops Breast:Examined in sitting and supine position were symmetrical in appearance, no palpable masses or tenderness,  no skin retraction, no nipple inversion, no nipple discharge, no skin discoloration, no axillary or supraclavicular lymphadenopathy Abdomen: no palpable masses or tenderness, no rebound or guarding Extremities: no edema or skin discoloration or tenderness  Pelvic: Vulva: Normal             Vagina: No gross lesions or discharge  Cervix: No gross lesions or discharge.  IUD strings seen.  Pap reflex done  Uterus  AV, normal size, shape and consistency, non-tender and mobile  Adnexa  Without masses or tenderness  Anus: Normal   Assessment/Plan:  42 y.o. female for annual exam   1. Encounter for routine gynecological examination with Papanicolaou smear of cervix Gynecologic exam normal.  Pap reflex done today.  Screening mammogram negative in February 2019.  Health labs with family physician.  2. Encounter for routine checking of intrauterine contraceptive device (IUD) Mirena IUD well-tolerated and in good position.  Inserted in December 2016.  [name] MD, 10:27 AM 05/01/2018

## 2018-05-02 LAB — PAP IG W/ RFLX HPV ASCU

## 2018-05-03 ENCOUNTER — Encounter: Payer: Self-pay | Admitting: *Deleted

## 2018-05-05 ENCOUNTER — Encounter: Payer: Self-pay | Admitting: Obstetrics & Gynecology

## 2018-05-05 NOTE — Patient Instructions (Addendum)
1. Encounter for routine gynecological examination with Papanicolaou smear of cervix Gynecologic exam normal.  Pap reflex done today.  Screening mammogram negative in February 2019.  Health labs with family physician.     2. Encounter for routine checking of intrauterine contraceptive device (IUD) Mirena IUD well-tolerated and in good position.  Inserted in December 2016.  Quanesha, it was a pleasure seeing you today!  I will inform you of your results as soon as they are available.

## 2018-07-05 ENCOUNTER — Encounter: Payer: Self-pay | Admitting: Family Medicine

## 2018-07-05 DIAGNOSIS — T7840XA Allergy, unspecified, initial encounter: Secondary | ICD-10-CM

## 2018-08-14 ENCOUNTER — Encounter: Payer: Self-pay | Admitting: Allergy

## 2018-08-14 ENCOUNTER — Ambulatory Visit (INDEPENDENT_AMBULATORY_CARE_PROVIDER_SITE_OTHER): Payer: BLUE CROSS/BLUE SHIELD | Admitting: Allergy

## 2018-08-14 VITALS — BP 128/80 | HR 76 | Resp 16 | Ht 67.0 in | Wt 155.2 lb

## 2018-08-14 DIAGNOSIS — J3089 Other allergic rhinitis: Secondary | ICD-10-CM | POA: Diagnosis not present

## 2018-08-14 DIAGNOSIS — T781XXD Other adverse food reactions, not elsewhere classified, subsequent encounter: Secondary | ICD-10-CM

## 2018-08-14 DIAGNOSIS — T781XXA Other adverse food reactions, not elsewhere classified, initial encounter: Secondary | ICD-10-CM | POA: Insufficient documentation

## 2018-08-14 DIAGNOSIS — J310 Chronic rhinitis: Secondary | ICD-10-CM

## 2018-08-14 MED ORDER — FLUTICASONE PROPIONATE 50 MCG/ACT NA SUSP: 1.0000 | Freq: Every day | NASAL | 2 refills | Status: DC

## 2018-08-14 MED ORDER — EPINEPHRINE 0.3 MG/0.3ML IJ SOAJ: 0.3000 mg | Freq: Once | INTRAMUSCULAR | 2 refills | Status: AC

## 2018-08-14 NOTE — Assessment & Plan Note (Signed)
Tree nut allergy since a teenager but was able to tolerate cashews and coconut. Most recently had reaction to [facility] made with almond flour in the form of lip angioedema. No previous work up. Also avoids avocados and mushrooms as they cause abdominal pains.  Today's skin testing showed: negative to foods. The patients history suggests tree nuts allergy, though todays skin tests were negative despite a positive histamine control.  Food allergen skin testing has excellent negative predictive value however there is still a 5% chance that the allergy exists. Therefore, we will investigate further with serum specific IgE levels and, if negative then schedule for open graded oral food challenge. A laboratory order form has been provided for serum specific IgE against nut panel, avocado and mushroom. Until the food allergy has been definitively ruled out, the patient is to continue meticulous avoidance of peanuts, tree nuts, avocado and mushroom and have access to epinephrine autoinjector 2 pack. I have prescribed epinephrine injectable and demonstrated proper use. For mild symptoms you can take over the counter antihistamines such as Benadryl and monitor symptoms closely. If symptoms worsen or if you have severe symptoms including breathing issues, throat closure, significant swelling, whole body hives, severe diarrhea and vomiting, lightheadedness then inject epinephrine and seek immediate medical care afterwards.

## 2018-08-14 NOTE — Assessment & Plan Note (Signed)
Rhino conjunctivitis symptoms for the past 30 years but worse the past 6 years since moved to [place] from [place]. Tried zyrtec with some benefit. No previous work up.  Today's skin testing was negative to environmental allergies - both indoor and outdoor.  Most likely has non-allergic rhinitis.   May use over the counter antihistamines such as Zyrtec (cetirizine), Claritin (loratadine), Allegra (fexofenadine), or Xyzal (levocetirizine) daily as needed.  Nasal saline spray (i.e., Simply Saline) or nasal saline lavage (i.e., NeilMed) is recommended as needed.

## 2018-08-14 NOTE — Patient Instructions (Addendum)
Today's skin testing showed:negative to environmental allergies and foods.  Continue to avoid peanuts, tree nuts, avocado and mushroom. Get bloodwork  I have prescribed epinephrine injectable and demonstrated proper use. For mild symptoms you can take over the counter antihistamines such as Benadryl and monitor symptoms closely. If symptoms worsen or if you have severe symptoms including breathing issues, throat closure, significant swelling, whole body hives, severe diarrhea and vomiting, lightheadedness then inject epinephrine and seek immediate medical care afterwards.  May use over the counter antihistamines such as Zyrtec (cetirizine), Claritin (loratadine), Allegra (fexofenadine), or Xyzal (levocetirizine) daily as needed.  Nasal saline spray (i.e., Simply Saline) or nasal saline lavage (i.e., NeilMed) is recommended as needed.   Follow up in 4 months

## 2018-08-14 NOTE — Progress Notes (Signed)
New Patient Note  RE: Dawn Evans MRN: [id number] DOB: 12/10/1975 Date of Office Visit: 08/14/2018  Referring provider: Midge Minium, MD Primary care provider: Midge Minium, MD  Chief Complaint: Food Intolerance (nuts and tree nuts); Rash; and Allergic Rhinitis  (eye swelling, itchy eyes and )  History of Present Illness: I had the pleasure of seeing Dawn Evans for initial evaluation at the [facility] on 08/14/2018. She is a 42 y.o. female, who is referred here by Midge Minium, MD for the evaluation of food allergies, rash and allergic rhinitis.   Food allergies: She reports food allergy to tree nuts except for cashews and coconut.   The reaction occurred at the age of 53-18, after she ate some type of tree nuts. Symptoms started within 1 hour and was in the form of facial rash and pruritis. Denies any wheezing, abdominal pain, diarrhea, vomiting. The rash usually takes a few hours to resolve.   Most recently she had macaroons which caused lip angioedema within 20 minutes. The symptoms resolved the next day with antihistamines. Denies any associated cofactors such as exertion, infection, NSAID use, or alcohol consumption. She was never evaluated in ED.  She does not have access to epinephrine autoinjector.  Avocados and mushrooms caused abdominal pains in the past and has been avoiding them.  Past work up includes: none. Dietary History: patient has been eating other foods including milk, eggs, sesame, shellfish, seafood, soy, wheat, meats, fruits and vegetables.  Allergic rhinitis: She reports symptoms of itchy/watery eyes, rhinorrhea, sneezing, nasal congestion. Symptoms have been going on for 30 years but worse the past 6 years since she moved from [place] to [place]. The symptoms are present during the spring through fall. Anosmia: no. Headache: yes. She has used Claritin, zyrtec with some improvement in symptoms. Sinus infections: none this  year. Previous work up includes: none.  Assessment and Plan: Dawn Evans is a 42 y.o. female with: Adverse food reaction Tree nut allergy since a teenager but was able to tolerate cashews and coconut. Most recently had reaction to [facility] made with almond flour in the form of lip angioedema. No previous work up. Also avoids avocados and mushrooms as they cause abdominal pains.  Today's skin testing showed: negative to foods. The patients history suggests tree nuts allergy, though todays skin tests were negative despite a positive histamine control.  Food allergen skin testing has excellent negative predictive value however there is still a 5% chance that the allergy exists. Therefore, we will investigate further with serum specific IgE levels and, if negative then schedule for open graded oral food challenge. A laboratory order form has been provided for serum specific IgE against nut panel, avocado and mushroom. Until the food allergy has been definitively ruled out, the patient is to continue meticulous avoidance of peanuts, tree nuts, avocado and mushroom and have access to epinephrine autoinjector 2 pack. I have prescribed epinephrine injectable and demonstrated proper use. For mild symptoms you can take over the counter antihistamines such as Benadryl and monitor symptoms closely. If symptoms worsen or if you have severe symptoms including breathing issues, throat closure, significant swelling, whole body hives, severe diarrhea and vomiting, lightheadedness then inject epinephrine and seek immediate medical care afterwards.  Chronic rhinitis Rhino conjunctivitis symptoms for the past 30 years but worse the past 6 years since moved to [place] from [place]. Tried zyrtec with some benefit. No previous work up.  Today's skin testing was negative to environmental allergies -  both indoor and outdoor.  Most likely has non-allergic rhinitis.   May use over the counter antihistamines such as Zyrtec  (cetirizine), Claritin (loratadine), Allegra (fexofenadine), or Xyzal (levocetirizine) daily as needed.  Nasal saline spray (i.e., Simply Saline) or nasal saline lavage (i.e., NeilMed) is recommended as needed.  Return in about 4 months (around 12/14/2018).  Meds ordered this encounter  Medications  . EPINEPHrine (AUVI-Q) 0.3 mg/0.3 mL IJ SOAJ injection    Sig: Inject 0.3 mLs (0.3 mg total) into the muscle once for 1 dose.    Dispense:  2 Device    Refill:  2    [phone]  . fluticasone (FLONASE) 50 MCG/ACT nasal spray    Sig: Place 1 spray into both nostrils daily.    Dispense:  16 g    Refill:  2    Lab Orders     IgE Nut Prof. w/Component Rflx     Allergen Avocado f96     Allergen, Mushroom, Rf212  Other allergy screening: Asthma: no Rhino conjunctivitis: yes Food allergy: yes Medication allergy: no Hymenoptera allergy: no Urticaria: no Eczema:no History of recurrent infections suggestive of immunodeficency: no  Diagnostics: Skin Testing: Environmental allergy panel and select foods. Negative test to: environmental and select foods.  Results discussed with patient/family. Airborne Adult Perc - 08/14/18 0943    Allergen Manufacturer  Lavella Hammock    Location  Back    Number of Test  59    Panel 1  Select    1. Control-Buffer 50% Glycerol  Negative    2. Control-Histamine 1 mg/ml  4+    3. Albumin saline  Negative    4. [place]  Negative    5. [place]  Negative    6. Johnson  Negative    7. [place] Blue  Negative    8. Meadow Fescue  Negative    9. Perennial Rye  Negative    10. Sweet Vernal  Negative    11. Timothy  Negative    12. Cocklebur  Negative    13. Burweed Marshelder  Negative    14. Ragweed, short  Negative    15. Ragweed, Giant  Negative    16. Plantain,  English  Negative    17. Lamb's Quarters  Negative    18. Sheep Sorrell  Negative    19. Rough Pigweed  Negative    20. Marsh Elder, Rough  Negative    21. Mugwort, Common  Negative    22. Ash  mix  Negative    23. Birch mix  Negative    24. Beech American  Negative    25. Box, Elder  Negative    26. Cedar, red  Negative    27. Cottonwood, [place]  Negative    28. Elm mix  Negative    29. Hickory mix  Negative    30. Maple mix  Negative    31. Oak, [place] mix  Negative    32. Pecan Pollen  Negative    33. Pine mix  Negative    34. Sycamore Eastern  Negative    35. Clyman, Black Pollen  Negative    36. Alternaria alternata  Negative    37. Cladosporium Herbarum  Negative    38. Aspergillus mix  Negative    39. Penicillium mix  Negative    40. Bipolaris sorokiniana (Helminthosporium)  Negative    41. Drechslera spicifera (Curvularia)  Negative    42. Mucor plumbeus  Negative    43. Fusarium moniliforme  Negative    44. Aureobasidium pullulans (pullulara)  Negative    45. Rhizopus oryzae  Negative    46. Botrytis cinera  Negative    47. Epicoccum nigrum  Negative    48. Phoma betae  Negative    49. Candida Albicans  Negative    50. Trichophyton mentagrophytes  Negative    51. Mite, D Farinae  5,000 AU/ml  Negative    52. Mite, D Pteronyssinus  5,000 AU/ml  Negative    53. Cat Hair 10,000 BAU/ml  Negative    54.  Dog Epithelia  Negative    55. Mixed Feathers  Negative    56. Horse Epithelia  Negative    57. Cockroach, German  Negative    58. Mouse  Negative    59. Tobacco Leaf  Negative     Intradermal - 08/14/18 1024    Time Antigen Placed  1024    Allergen Manufacturer  Lavella Hammock    Location  Arm    Number of Test  15    Intradermal  Select    Control  Negative    Guatemala  Negative    Johnson  Negative    7 Grass  Negative    Ragweed mix  Negative    Weed mix  Negative    Tree mix  Negative    Mold 1  Negative    Mold 2  Negative    Mold 3  Negative    Mold 4  Negative    Cat  Negative    Dog  Negative    Cockroach  Negative    Mite mix  Negative     Food Adult Perc - 08/14/18 0944    Time Antigen Placed  1045    Allergen Manufacturer  Lavella Hammock     Location  Back    Number of allergen test  21     Control-buffer 50% Glycerol  Negative    Control-Histamine 1 mg/ml  4+    1. Peanut  Negative    2. Soybean  Negative    3. Wheat  Negative    4. Sesame  Negative    5. Milk, cow  Negative    6. Egg White, Chicken  Negative    7. Casein  Negative    8. Shellfish Mix  Negative    9. Fish Mix  Negative    10. Cashew  Negative    11. Pecan Food  Negative    12. [place]  Negative    13. Almond  Negative    14. Hazelnut  Negative    15. [place] nut  Negative    17. Pistachio  Negative    47. Mushrooms  Negative    48. Avocado  Negative       Past Medical History: Patient Active Problem List   Diagnosis Date Noted  . Adverse food reaction 08/14/2018  . Vitamin D deficiency 04/20/2017  . Physical exam 04/16/2015  . Right medial knee pain 11/16/2014  . Chronic rhinitis 11/16/2014  . IUD (intrauterine device) in place 10/03/2012  . Painful lumpy breasts 10/03/2012   History reviewed. No pertinent past medical history. Past Surgical History: Past Surgical History:  Procedure Laterality Date  . KNEE SURGERY  2016  . TONSILLECTOMY    . waters hernias     Medication List:  Current Outpatient Medications  Medication Sig Dispense Refill  . cetirizine (ZYRTEC) 10 MG tablet Take 10 mg by mouth daily as needed for  allergies.    . Cholecalciferol (VITAMIN D3 PO) Take 2,000 Units by mouth daily.    Marland Kitchen levonorgestrel (MIRENA) 20 MCG/24HR IUD 1 each by Intrauterine route once.    Marland Kitchen EPINEPHrine (AUVI-Q) 0.3 mg/0.3 mL IJ SOAJ injection Inject 0.3 mLs (0.3 mg total) into the muscle once for 1 dose. 2 Device 2  . fluticasone (FLONASE) 50 MCG/ACT nasal spray Place 1 spray into both nostrils daily. 16 g 2   No current facility-administered medications for this visit.    Allergies: Allergies  Allergen Reactions  . Avocado Other (See Comments)    Stomach pains  . Almond Meal Rash  . Peanuts [Peanut Oil] Rash   Social  History: Social History   Socioeconomic History  . Marital status: Married    Spouse name: Not on file  . Number of children: Not on file  . Years of education: Not on file  . Highest education level: Not on file  Occupational History  . Not on file  Social Needs  . Financial resource strain: Not on file  . Food insecurity:    Worry: Not on file    Inability: Not on file  . Transportation needs:    Medical: Not on file    Non-medical: Not on file  Tobacco Use  . Smoking status: Never Smoker  . Smokeless tobacco: Never Used  Substance and Sexual Activity  . Alcohol use: Yes    Comment: social- wine   . Drug use: No  . Sexual activity: Yes    Partners: Male    Birth control/protection: IUD    Comment: 1st intercourse- 10, partner- 1, married- 69 yrs   Lifestyle  . Physical activity:    Days per week: Not on file    Minutes per session: Not on file  . Stress: Not on file  Relationships  . Social connections:    Talks on phone: Not on file    Gets together: Not on file    Attends religious service: Not on file    Active member of club or organization: Not on file    Attends meetings of clubs or organizations: Not on file    Relationship status: Not on file  Other Topics Concern  . Not on file  Social History Narrative  . Not on file   Lives in a 41 year old home. Smoking: denies Occupation: interior Scientist, clinical (histocompatibility and immunogenetics) HistoryFreight forwarder in the house: no Carpet in the family room: no Carpet in the bedroom: yes Heating: gas and electric Cooling: central Pet: no  Family History: Family History  Problem Relation Age of Onset  . Diabetes Mother   . Diabetes Maternal Uncle   . Diabetes Maternal Grandmother   . Hypertension Maternal Grandmother    Problem                               Relation Asthma                                   No  Eczema                                No  Food allergy  No  Allergic  rhino conjunctivitis     No   Review of Systems  Constitutional: Negative for appetite change, chills, fever and unexpected weight change.  HENT: Negative for congestion and rhinorrhea.   Eyes: Negative for itching.  Respiratory: Negative for cough, chest tightness, shortness of breath and wheezing.   Cardiovascular: Negative for chest pain.  Gastrointestinal: Negative for abdominal pain.  Genitourinary: Negative for difficulty urinating.  Skin: Negative for rash.  Allergic/Immunologic: Positive for food allergies. Negative for environmental allergies.  Neurological: Positive for headaches.   Objective: BP 128/80   Pulse 76   Resp 16   Ht 5\' 7"  (1.702 m)   Wt 155 lb 3.2 oz (70.4 kg)   SpO2 99%   BMI 24.31 kg/m  Body mass index is 24.31 kg/m. Physical Exam  Constitutional: She is oriented to person, place, and time. She appears well-developed and well-nourished.  HENT:  Head: Normocephalic and atraumatic.  Right Ear: External ear normal.  Left Ear: External ear normal.  Nose: Nose normal.  Mouth/Throat: Oropharynx is clear and moist.  Eyes: Conjunctivae and EOM are normal.  Neck: Neck supple.  Cardiovascular: Normal rate, regular rhythm and normal heart sounds. Exam reveals no gallop and no friction rub.  No murmur heard. Pulmonary/Chest: Effort normal and breath sounds normal. She has no wheezes. She has no rales.  Abdominal: Soft.  Lymphadenopathy:    She has no cervical adenopathy.  Neurological: She is alert and oriented to person, place, and time.  Skin: Skin is warm. No rash noted.  Psychiatric: She has a normal mood and affect. Her behavior is normal.  Nursing note and vitals reviewed.  The plan was reviewed with the patient/family, and all questions/concerned were addressed.  It was my pleasure to see Dawn Evans today and participate in her care. Please feel free to contact me with any questions or concerns.  Sincerely,  Rexene Alberts, DO Allergy &  Immunology  [facility] [place] office: [phone] [place]

## 2018-08-17 LAB — ALLERGEN AVOCADO F96

## 2018-08-17 LAB — IGE NUT PROF. W/COMPONENT RFLX
Brazil Nut IgE: <0.1 kU/L
F020-IgE Almond: <0.1 kU/L
Hazelnut (Filbert) IgE: <0.1 kU/L
Peanut, IgE: <0.1 kU/L
Walnut IgE: <0.1 kU/L

## 2018-08-17 LAB — ALLERGEN, MUSHROOM, RF212: Mushroom IgE: <0.1 kU/L

## 2018-08-19 ENCOUNTER — Encounter: Payer: Self-pay | Admitting: Allergy

## 2018-10-14 DIAGNOSIS — M9903 Segmental and somatic dysfunction of lumbar region: Secondary | ICD-10-CM | POA: Diagnosis not present

## 2018-10-14 DIAGNOSIS — S76311A Strain of muscle, fascia and tendon of the posterior muscle group at thigh level, right thigh, initial encounter: Secondary | ICD-10-CM | POA: Diagnosis not present

## 2018-10-14 DIAGNOSIS — M9901 Segmental and somatic dysfunction of cervical region: Secondary | ICD-10-CM | POA: Diagnosis not present

## 2018-10-14 DIAGNOSIS — S76312A Strain of muscle, fascia and tendon of the posterior muscle group at thigh level, left thigh, initial encounter: Secondary | ICD-10-CM | POA: Diagnosis not present

## 2018-10-21 DIAGNOSIS — S76311A Strain of muscle, fascia and tendon of the posterior muscle group at thigh level, right thigh, initial encounter: Secondary | ICD-10-CM | POA: Diagnosis not present

## 2018-10-21 DIAGNOSIS — M9903 Segmental and somatic dysfunction of lumbar region: Secondary | ICD-10-CM | POA: Diagnosis not present

## 2018-10-21 DIAGNOSIS — S76312A Strain of muscle, fascia and tendon of the posterior muscle group at thigh level, left thigh, initial encounter: Secondary | ICD-10-CM | POA: Diagnosis not present

## 2018-10-21 DIAGNOSIS — M9901 Segmental and somatic dysfunction of cervical region: Secondary | ICD-10-CM | POA: Diagnosis not present

## 2018-10-25 ENCOUNTER — Other Ambulatory Visit: Payer: Self-pay | Admitting: Family Medicine

## 2018-10-25 DIAGNOSIS — Z1231 Encounter for screening mammogram for malignant neoplasm of breast: Secondary | ICD-10-CM

## 2018-10-28 ENCOUNTER — Ambulatory Visit
Admission: RE | Admit: 2018-10-28 | Discharge: 2018-10-28 | Disposition: A | Discharge status: Home / Self Care | Payer: BLUE CROSS/BLUE SHIELD | Source: Ambulatory Visit

## 2018-10-28 DIAGNOSIS — Z1231 Encounter for screening mammogram for malignant neoplasm of breast: Secondary | ICD-10-CM

## 2018-10-30 DIAGNOSIS — M9901 Segmental and somatic dysfunction of cervical region: Secondary | ICD-10-CM | POA: Diagnosis not present

## 2018-10-30 DIAGNOSIS — S76311A Strain of muscle, fascia and tendon of the posterior muscle group at thigh level, right thigh, initial encounter: Secondary | ICD-10-CM | POA: Diagnosis not present

## 2018-10-30 DIAGNOSIS — M9903 Segmental and somatic dysfunction of lumbar region: Secondary | ICD-10-CM | POA: Diagnosis not present

## 2018-10-30 DIAGNOSIS — S76312A Strain of muscle, fascia and tendon of the posterior muscle group at thigh level, left thigh, initial encounter: Secondary | ICD-10-CM | POA: Diagnosis not present

## 2018-11-04 DIAGNOSIS — S76312A Strain of muscle, fascia and tendon of the posterior muscle group at thigh level, left thigh, initial encounter: Secondary | ICD-10-CM | POA: Diagnosis not present

## 2018-11-04 DIAGNOSIS — S76311A Strain of muscle, fascia and tendon of the posterior muscle group at thigh level, right thigh, initial encounter: Secondary | ICD-10-CM | POA: Diagnosis not present

## 2018-11-04 DIAGNOSIS — M9901 Segmental and somatic dysfunction of cervical region: Secondary | ICD-10-CM | POA: Diagnosis not present

## 2018-11-04 DIAGNOSIS — M9903 Segmental and somatic dysfunction of lumbar region: Secondary | ICD-10-CM | POA: Diagnosis not present

## 2018-11-13 DIAGNOSIS — S76312A Strain of muscle, fascia and tendon of the posterior muscle group at thigh level, left thigh, initial encounter: Secondary | ICD-10-CM | POA: Diagnosis not present

## 2018-11-13 DIAGNOSIS — S76311A Strain of muscle, fascia and tendon of the posterior muscle group at thigh level, right thigh, initial encounter: Secondary | ICD-10-CM | POA: Diagnosis not present

## 2018-11-13 DIAGNOSIS — M9903 Segmental and somatic dysfunction of lumbar region: Secondary | ICD-10-CM | POA: Diagnosis not present

## 2018-11-13 DIAGNOSIS — M9901 Segmental and somatic dysfunction of cervical region: Secondary | ICD-10-CM | POA: Diagnosis not present

## 2019-02-24 ENCOUNTER — Encounter: Payer: Self-pay | Admitting: Family Medicine

## 2019-02-26 ENCOUNTER — Encounter: Payer: Self-pay | Admitting: Family Medicine

## 2019-04-29 ENCOUNTER — Ambulatory Visit (INDEPENDENT_AMBULATORY_CARE_PROVIDER_SITE_OTHER): Payer: BC Managed Care – PPO | Admitting: Family Medicine

## 2019-04-29 ENCOUNTER — Other Ambulatory Visit: Payer: Self-pay

## 2019-04-29 ENCOUNTER — Encounter: Payer: Self-pay | Admitting: Family Medicine

## 2019-04-29 VITALS — BP 120/73 | HR 69 | Temp 98.7°F | Resp 16 | Ht 67.0 in | Wt 150.5 lb

## 2019-04-29 DIAGNOSIS — Z0000 Encounter for general adult medical examination without abnormal findings: Secondary | ICD-10-CM

## 2019-04-29 DIAGNOSIS — E559 Vitamin D deficiency, unspecified: Secondary | ICD-10-CM | POA: Diagnosis not present

## 2019-04-29 LAB — LIPID PANEL
Cholesterol: 165 mg/dL (ref 0–200)
HDL: 55.7 mg/dL (ref >39.00)
LDL Cholesterol: 95 mg/dL (ref 0–99)
NonHDL: 109.51
Total CHOL/HDL Ratio: 3
Triglycerides: 75 mg/dL (ref 0.0–149.0)
VLDL: 15 mg/dL (ref 0.0–40.0)

## 2019-04-29 LAB — CBC WITH DIFFERENTIAL/PLATELET
Basophils Absolute: 0 10*3/uL (ref 0.0–0.1)
Basophils Relative: 0.7 % (ref 0.0–3.0)
Eosinophils Absolute: 0.2 10*3/uL (ref 0.0–0.7)
Eosinophils Relative: 2.6 % (ref 0.0–5.0)
HCT: 43.8 % (ref 36.0–46.0)
Hemoglobin: 14.3 g/dL (ref 12.0–15.0)
Lymphocytes Relative: 23 % (ref 12.0–46.0)
Lymphs Abs: 1.5 10*3/uL (ref 0.7–4.0)
MCHC: 32.7 g/dL (ref 30.0–36.0)
MCV: 92.6 fl (ref 78.0–100.0)
Monocytes Absolute: 0.3 10*3/uL (ref 0.1–1.0)
Monocytes Relative: 5.3 % (ref 3.0–12.0)
Neutro Abs: 4.4 10*3/uL (ref 1.4–7.7)
Neutrophils Relative %: 68.4 % (ref 43.0–77.0)
Platelets: 284 10*3/uL (ref 150.0–400.0)
RBC: 4.73 Mil/uL (ref 3.87–5.11)
RDW: 13.5 % (ref 11.5–15.5)
WBC: 6.5 10*3/uL (ref 4.0–10.5)

## 2019-04-29 LAB — HEPATIC FUNCTION PANEL
ALT: 11 U/L (ref 0–35)
AST: 16 U/L (ref 0–37)
Albumin: 4.8 g/dL (ref 3.5–5.2)
Alkaline Phosphatase: 33 U/L — ABNORMAL LOW (ref 39–117)
Bilirubin, Direct: 0.1 mg/dL (ref 0.0–0.3)
Total Bilirubin: 0.6 mg/dL (ref 0.2–1.2)
Total Protein: 7.1 g/dL (ref 6.0–8.3)

## 2019-04-29 LAB — BASIC METABOLIC PANEL
BUN: 11 mg/dL (ref 6–23)
CO2: 25 mEq/L (ref 19–32)
Calcium: 9.3 mg/dL (ref 8.4–10.5)
Chloride: 105 mEq/L (ref 96–112)
Creatinine, Ser: 0.72 mg/dL (ref 0.40–1.20)
GFR: 88.59 mL/min (ref >60.00)
Glucose, Bld: 85 mg/dL (ref 70–99)
Potassium: 4.4 mEq/L (ref 3.5–5.1)
Sodium: 137 mEq/L (ref 135–145)

## 2019-04-29 LAB — VITAMIN D 25 HYDROXY (VIT D DEFICIENCY, FRACTURES): VITD: 44.44 ng/mL (ref 30.00–100.00)

## 2019-04-29 NOTE — Progress Notes (Signed)
   Subjective:    Patient ID: Dawn Evans, female    DOB: September 06, 1975, 43 y.o.   MRN: [id number]  HPI CPE- UTD on pap, mammo, Tdap.  Declines flu.   Review of Systems Patient reports no vision/ hearing changes, adenopathy,fever, weight change,  persistant/recurrent hoarseness , swallowing issues, chest pain, palpitations, edema, persistant/recurrent cough, hemoptysis, dyspnea (rest/exertional/paroxysmal nocturnal), gastrointestinal bleeding (melena, rectal bleeding), abdominal pain, significant heartburn, bowel changes, GU symptoms (dysuria, hematuria, incontinence), Gyn symptoms (abnormal  bleeding, pain),  syncope, focal weakness, memory loss, numbness & tingling, skin/hair/nail changes, abnormal bruising or bleeding, anxiety, or depression.     Objective:   Physical Exam General Appearance:    Alert, cooperative, no distress, appears stated age  Head:    Normocephalic, without obvious abnormality, atraumatic  Eyes:    PERRL, conjunctiva/corneas clear, EOM's intact, fundi    benign, both eyes  Ears:    Normal TM's and external ear canals, both ears  Nose:   Deferred due to COVID  Throat:   Neck:   Supple, symmetrical, trachea midline, no adenopathy;    Thyroid: no enlargement/tenderness/nodules  Back:     Symmetric, no curvature, ROM normal, no CVA tenderness  Lungs:     Clear to auscultation bilaterally, respirations unlabored  Chest Wall:    No tenderness or deformity   Heart:    Regular rate and rhythm, S1 and S2 normal, no murmur, rub   or gallop  Breast Exam:    Deferred to GYN  Abdomen:     Soft, non-tender, bowel sounds active all four quadrants,    no masses, no organomegaly  Genitalia:    Deferred to GYN  Rectal:    Extremities:   Extremities normal, atraumatic, no cyanosis or edema  Pulses:   2+ and symmetric all extremities  Skin:   Skin color, texture, turgor normal, no rashes or lesions  Lymph nodes:   Cervical, supraclavicular, and axillary nodes normal   Neurologic:   CNII-XII intact, normal strength, sensation and reflexes    throughout          Assessment & Plan:

## 2019-04-29 NOTE — Patient Instructions (Addendum)
Follow up in 1 year- sooner if needed We'll notify you of your lab results and make any changes if needed Keep up the good work!  You look great! Call with any questions or concerns Stay Safe!!

## 2019-04-29 NOTE — Assessment & Plan Note (Signed)
Pt's PE WNL.  UTD on Tdap, GYN.  Check labs.  Anticipatory guidance provided.

## 2019-04-29 NOTE — Assessment & Plan Note (Signed)
Chronic problem.  Check labs.  Replete prn. 

## 2019-05-09 ENCOUNTER — Encounter: Payer: BLUE CROSS/BLUE SHIELD | Admitting: Obstetrics & Gynecology

## 2019-06-03 ENCOUNTER — Other Ambulatory Visit: Payer: Self-pay

## 2019-06-04 ENCOUNTER — Ambulatory Visit (INDEPENDENT_AMBULATORY_CARE_PROVIDER_SITE_OTHER): Payer: BC Managed Care – PPO | Admitting: Obstetrics & Gynecology

## 2019-06-04 ENCOUNTER — Encounter: Payer: Self-pay | Admitting: Obstetrics & Gynecology

## 2019-06-04 VITALS — BP 120/70 | Ht 66.0 in | Wt 154.0 lb

## 2019-06-04 DIAGNOSIS — Z01419 Encounter for gynecological examination (general) (routine) without abnormal findings: Secondary | ICD-10-CM

## 2019-06-04 DIAGNOSIS — N943 Premenstrual tension syndrome: Secondary | ICD-10-CM | POA: Diagnosis not present

## 2019-06-04 DIAGNOSIS — Z30431 Encounter for routine checking of intrauterine contraceptive device: Secondary | ICD-10-CM | POA: Diagnosis not present

## 2019-06-04 MED ORDER — NORETHINDRONE 0.35 MG PO TABS: 1.0000 | ORAL_TABLET | Freq: Every day | ORAL | 4 refills | Status: DC

## 2019-06-04 NOTE — Progress Notes (Signed)
Dawn Evans 10-16-1975 [id number]   History:    43 y.o. G2P2L2 Married.  Son 7th grade, daughter 6th grade.  RP:  Established patient presenting for annual gyn exam   HPI: Well on Mirena IUD x 08/2015.  Light menses.  No pelvic pain.  No pain with intercourse.  Severe bilateral breast tenderness in premenstrual period.  Breast normal otherwise with no lump.  Urine and bowel movements normal.  Body mass index 24.86.  Good fitness and healthy nutrition.  Health labs with family physician.  Past medical history,surgical history, family history and social history were all reviewed and documented in the EPIC chart.  Gynecologic History Patient's last menstrual period was 05/24/2019. Contraception: Mirena IUD x 08/2015 Last Pap: 04/2018. Results were: Negative Last mammogram: 10/2018. Results were: Negative Bone Density: Never Colonoscopy: Never  Obstetric History OB History  Gravida Para Term Preterm AB Living  2 2       2   SAB TAB Ectopic Multiple Live Births               # Outcome Date GA Lbr Len/2nd Weight Sex Delivery Anes PTL Lv  2 Para           1 Para              ROS: A ROS was performed and pertinent positives and negatives are included in the history.  GENERAL: No fevers or chills. HEENT: No change in vision, no earache, sore throat or sinus congestion. NECK: No pain or stiffness. CARDIOVASCULAR: No chest pain or pressure. No palpitations. PULMONARY: No shortness of breath, cough or wheeze. GASTROINTESTINAL: No abdominal pain, nausea, vomiting or diarrhea, melena or bright red blood per rectum. GENITOURINARY: No urinary frequency, urgency, hesitancy or dysuria. MUSCULOSKELETAL: No joint or muscle pain, no back pain, no recent trauma. DERMATOLOGIC: No rash, no itching, no lesions. ENDOCRINE: No polyuria, polydipsia, no heat or cold intolerance. No recent change in weight. HEMATOLOGICAL: No anemia or easy bruising or bleeding. NEUROLOGIC: No headache, seizures, numbness,  tingling or weakness. PSYCHIATRIC: No depression, no loss of interest in normal activity or change in sleep pattern.     Exam:   BP 120/70   Ht 5\' 6"  (1.676 m)   Wt 154 lb (69.9 kg)   LMP 05/24/2019   BMI 24.86 kg/m   Body mass index is 24.86 kg/m.  General appearance : Well developed well nourished female. No acute distress HEENT: Eyes: no retinal hemorrhage or exudates,  Neck supple, trachea midline, no carotid bruits, no thyroidmegaly Lungs: Clear to auscultation, no rhonchi or wheezes, or rib retractions  Heart: Regular rate and rhythm, no murmurs or gallops Breast:Examined in sitting and supine position were symmetrical in appearance, no palpable masses or tenderness,  no skin retraction, no nipple inversion, no nipple discharge, no skin discoloration, no axillary or supraclavicular lymphadenopathy Abdomen: no palpable masses or tenderness, no rebound or guarding Extremities: no edema or skin discoloration or tenderness  Pelvic: Vulva: Normal             Vagina: No gross lesions or discharge  Cervix: No gross lesions or discharge.  IUD strings felt at external os.  Uterus  AV, normal size, shape and consistency, non-tender and mobile  Adnexa  Without masses or tenderness  Anus: Normal   Assessment/Plan:  43 y.o. female for annual exam   1. Well female exam with routine gynecological exam Normal gynecologic exam.  Pap test August 2019 was negative, no indication to repeat  this year.  Breast exam normal.  Screening mammogram February 2020 was negative.  Good body mass index at 24.86.  Continue with fitness and healthy nutrition.  Health labs with family physician.  2. Encounter for routine checking of intrauterine contraceptive device (IUD) Mirena IUD well-tolerated and in good position.  Inserted December 2016.  3. PMS (premenstrual syndrome) Severe bilateral breast tenderness in premenstrual period.  Decision to control the cycle with the progestin only birth control  pill.  No contraindication.  Usage reviewed and prescription sent to pharmacy.  Other orders - Turmeric 400 MG CAPS; Take by mouth. - magnesium 30 MG tablet; Take 30 mg by mouth 2 (two) times daily. - norethindrone (MICRONOR) 0.35 MG tablet; Take 1 tablet (0.35 mg total) by mouth daily.  [name] MD, 12:35 PM 06/04/2019

## 2019-06-04 NOTE — Patient Instructions (Signed)
1. Well female exam with routine gynecological exam Normal gynecologic exam.  Pap test August 2019 was negative, no indication to repeat this year.  Breast exam normal.  Screening mammogram February 2020 was negative.  Good body mass index at 24.86.  Continue with fitness and healthy nutrition.  Health labs with family physician.  2. Encounter for routine checking of intrauterine contraceptive device (IUD) Mirena IUD well-tolerated and in good position.  Inserted December 2016.  3. PMS (premenstrual syndrome) Severe bilateral breast tenderness in premenstrual period.  Decision to control the cycle with the progestin only birth control pill.  No contraindication.  Usage reviewed and prescription sent to pharmacy.  Other orders - Turmeric 400 MG CAPS; Take by mouth. - magnesium 30 MG tablet; Take 30 mg by mouth 2 (two) times daily. - norethindrone (MICRONOR) 0.35 MG tablet; Take 1 tablet (0.35 mg total) by mouth daily.  Dawn Evans, it was a pleasure seeing you today!

## 2019-06-17 ENCOUNTER — Encounter: Payer: Self-pay | Admitting: Family Medicine

## 2019-06-23 DIAGNOSIS — M9905 Segmental and somatic dysfunction of pelvic region: Secondary | ICD-10-CM | POA: Diagnosis not present

## 2019-06-23 DIAGNOSIS — M9901 Segmental and somatic dysfunction of cervical region: Secondary | ICD-10-CM | POA: Diagnosis not present

## 2019-06-23 DIAGNOSIS — M7541 Impingement syndrome of right shoulder: Secondary | ICD-10-CM | POA: Diagnosis not present

## 2019-06-23 DIAGNOSIS — M9903 Segmental and somatic dysfunction of lumbar region: Secondary | ICD-10-CM | POA: Diagnosis not present

## 2019-07-01 DIAGNOSIS — M7541 Impingement syndrome of right shoulder: Secondary | ICD-10-CM | POA: Diagnosis not present

## 2019-07-01 DIAGNOSIS — M9903 Segmental and somatic dysfunction of lumbar region: Secondary | ICD-10-CM | POA: Diagnosis not present

## 2019-07-01 DIAGNOSIS — M9905 Segmental and somatic dysfunction of pelvic region: Secondary | ICD-10-CM | POA: Diagnosis not present

## 2019-07-01 DIAGNOSIS — M9901 Segmental and somatic dysfunction of cervical region: Secondary | ICD-10-CM | POA: Diagnosis not present

## 2019-07-10 DIAGNOSIS — M9901 Segmental and somatic dysfunction of cervical region: Secondary | ICD-10-CM | POA: Diagnosis not present

## 2019-07-10 DIAGNOSIS — M9903 Segmental and somatic dysfunction of lumbar region: Secondary | ICD-10-CM | POA: Diagnosis not present

## 2019-07-10 DIAGNOSIS — M9905 Segmental and somatic dysfunction of pelvic region: Secondary | ICD-10-CM | POA: Diagnosis not present

## 2019-07-10 DIAGNOSIS — M7541 Impingement syndrome of right shoulder: Secondary | ICD-10-CM | POA: Diagnosis not present

## 2019-07-24 DIAGNOSIS — M7541 Impingement syndrome of right shoulder: Secondary | ICD-10-CM | POA: Diagnosis not present

## 2019-07-24 DIAGNOSIS — M9901 Segmental and somatic dysfunction of cervical region: Secondary | ICD-10-CM | POA: Diagnosis not present

## 2019-07-24 DIAGNOSIS — M9903 Segmental and somatic dysfunction of lumbar region: Secondary | ICD-10-CM | POA: Diagnosis not present

## 2019-07-24 DIAGNOSIS — M9905 Segmental and somatic dysfunction of pelvic region: Secondary | ICD-10-CM | POA: Diagnosis not present

## 2019-07-25 ENCOUNTER — Encounter: Payer: Self-pay | Admitting: Family Medicine

## 2019-08-04 ENCOUNTER — Encounter: Payer: Self-pay | Admitting: Family Medicine

## 2019-08-08 DIAGNOSIS — M7541 Impingement syndrome of right shoulder: Secondary | ICD-10-CM | POA: Diagnosis not present

## 2019-08-08 DIAGNOSIS — M9901 Segmental and somatic dysfunction of cervical region: Secondary | ICD-10-CM | POA: Diagnosis not present

## 2019-08-08 DIAGNOSIS — M9903 Segmental and somatic dysfunction of lumbar region: Secondary | ICD-10-CM | POA: Diagnosis not present

## 2019-08-08 DIAGNOSIS — M9905 Segmental and somatic dysfunction of pelvic region: Secondary | ICD-10-CM | POA: Diagnosis not present

## 2019-08-15 ENCOUNTER — Encounter: Payer: Self-pay | Admitting: Family Medicine

## 2019-08-21 DIAGNOSIS — M9905 Segmental and somatic dysfunction of pelvic region: Secondary | ICD-10-CM | POA: Diagnosis not present

## 2019-08-21 DIAGNOSIS — M9901 Segmental and somatic dysfunction of cervical region: Secondary | ICD-10-CM | POA: Diagnosis not present

## 2019-08-21 DIAGNOSIS — M9903 Segmental and somatic dysfunction of lumbar region: Secondary | ICD-10-CM | POA: Diagnosis not present

## 2019-08-21 DIAGNOSIS — M7541 Impingement syndrome of right shoulder: Secondary | ICD-10-CM | POA: Diagnosis not present

## 2019-08-26 ENCOUNTER — Encounter: Payer: Self-pay | Admitting: Family Medicine

## 2019-09-05 DIAGNOSIS — Z8616 Personal history of COVID-19: Secondary | ICD-10-CM

## 2019-09-05 HISTORY — DX: Personal history of COVID-19: Z86.16

## 2019-10-24 ENCOUNTER — Other Ambulatory Visit: Payer: Self-pay | Admitting: Family Medicine

## 2019-10-24 DIAGNOSIS — Z1231 Encounter for screening mammogram for malignant neoplasm of breast: Secondary | ICD-10-CM

## 2019-10-31 ENCOUNTER — Other Ambulatory Visit: Payer: Self-pay

## 2019-10-31 ENCOUNTER — Ambulatory Visit
Admission: RE | Admit: 2019-10-31 | Discharge: 2019-10-31 | Disposition: A | Discharge status: Home / Self Care | Payer: BC Managed Care – PPO | Source: Ambulatory Visit

## 2019-10-31 DIAGNOSIS — Z1231 Encounter for screening mammogram for malignant neoplasm of breast: Secondary | ICD-10-CM | POA: Diagnosis not present

## 2019-11-28 ENCOUNTER — Encounter: Payer: Self-pay | Admitting: Family Medicine

## 2020-04-07 ENCOUNTER — Encounter: Payer: Self-pay | Admitting: Family Medicine

## 2020-04-07 DIAGNOSIS — Z0000 Encounter for general adult medical examination without abnormal findings: Secondary | ICD-10-CM

## 2020-04-07 DIAGNOSIS — E559 Vitamin D deficiency, unspecified: Secondary | ICD-10-CM

## 2020-04-14 ENCOUNTER — Other Ambulatory Visit: Payer: Self-pay

## 2020-04-14 ENCOUNTER — Telehealth: Payer: Self-pay | Admitting: Family Medicine

## 2020-04-14 ENCOUNTER — Ambulatory Visit (INDEPENDENT_AMBULATORY_CARE_PROVIDER_SITE_OTHER): Payer: BC Managed Care – PPO

## 2020-04-14 DIAGNOSIS — E559 Vitamin D deficiency, unspecified: Secondary | ICD-10-CM | POA: Diagnosis not present

## 2020-04-14 DIAGNOSIS — Z0000 Encounter for general adult medical examination without abnormal findings: Secondary | ICD-10-CM | POA: Diagnosis not present

## 2020-04-14 LAB — CBC WITH DIFFERENTIAL/PLATELET
Basophils Absolute: 0.1 K/uL (ref 0.0–0.1)
Basophils Relative: 0.8 % (ref 0.0–3.0)
Eosinophils Absolute: 0.2 K/uL (ref 0.0–0.7)
Eosinophils Relative: 2.1 % (ref 0.0–5.0)
HCT: 41 % (ref 36.0–46.0)
Hemoglobin: 14 g/dL (ref 12.0–15.0)
Lymphocytes Relative: 20.1 % (ref 12.0–46.0)
Lymphs Abs: 1.8 K/uL (ref 0.7–4.0)
MCHC: 34.1 g/dL (ref 30.0–36.0)
MCV: 91.4 fl (ref 78.0–100.0)
Monocytes Absolute: 0.4 K/uL (ref 0.1–1.0)
Monocytes Relative: 4.6 % (ref 3.0–12.0)
Neutro Abs: 6.4 K/uL (ref 1.4–7.7)
Neutrophils Relative %: 72.4 % (ref 43.0–77.0)
Platelets: 245 K/uL (ref 150.0–400.0)
RBC: 4.48 Mil/uL (ref 3.87–5.11)
RDW: 13 % (ref 11.5–15.5)
WBC: 8.8 K/uL (ref 4.0–10.5)

## 2020-04-14 LAB — BASIC METABOLIC PANEL WITH GFR
BUN: 9 mg/dL (ref 6–23)
CO2: 25 meq/L (ref 19–32)
Calcium: 9.6 mg/dL (ref 8.4–10.5)
Chloride: 102 meq/L (ref 96–112)
Creatinine, Ser: 0.67 mg/dL (ref 0.40–1.20)
GFR: 95.83 mL/min
Glucose, Bld: 80 mg/dL (ref 70–99)
Potassium: 3.9 meq/L (ref 3.5–5.1)
Sodium: 138 meq/L (ref 135–145)

## 2020-04-14 LAB — LIPID PANEL
Cholesterol: 152 mg/dL (ref 0–200)
HDL: 60.1 mg/dL
LDL Cholesterol: 74 mg/dL (ref 0–99)
NonHDL: 91.57
Total CHOL/HDL Ratio: 3
Triglycerides: 87 mg/dL (ref 0.0–149.0)
VLDL: 17.4 mg/dL (ref 0.0–40.0)

## 2020-04-14 LAB — HEPATIC FUNCTION PANEL
ALT: 11 U/L (ref 0–35)
AST: 16 U/L (ref 0–37)
Albumin: 4.5 g/dL (ref 3.5–5.2)
Alkaline Phosphatase: 34 U/L — ABNORMAL LOW (ref 39–117)
Bilirubin, Direct: 0.1 mg/dL (ref 0.0–0.3)
Total Bilirubin: 0.6 mg/dL (ref 0.2–1.2)
Total Protein: 7 g/dL (ref 6.0–8.3)

## 2020-04-14 LAB — VITAMIN D 25 HYDROXY (VIT D DEFICIENCY, FRACTURES): VITD: 50.4 ng/mL (ref 30.00–100.00)

## 2020-04-14 NOTE — Telephone Encounter (Signed)
I have placed a health care provider cpe form in the bin up front. The pt has a cpe scheduled for 04/30/20.

## 2020-04-14 NOTE — Telephone Encounter (Signed)
Paperwork given to PCP for signature.  

## 2020-04-30 ENCOUNTER — Other Ambulatory Visit: Payer: Self-pay

## 2020-04-30 ENCOUNTER — Encounter: Payer: Self-pay | Admitting: Family Medicine

## 2020-04-30 ENCOUNTER — Ambulatory Visit (INDEPENDENT_AMBULATORY_CARE_PROVIDER_SITE_OTHER): Payer: BC Managed Care – PPO | Admitting: Family Medicine

## 2020-04-30 VITALS — BP 118/72 | HR 70 | Temp 98.7°F | Resp 16 | Ht 66.0 in | Wt 150.4 lb

## 2020-04-30 DIAGNOSIS — Z0000 Encounter for general adult medical examination without abnormal findings: Secondary | ICD-10-CM

## 2020-04-30 NOTE — Patient Instructions (Signed)
Follow up in 1 year or as needed We'll notify you of your lab results and make any changes if needed Keep up the good work!  You look great! Call with any questions or concerns Stay Safe!  Stay Healthy!!! 

## 2020-04-30 NOTE — Telephone Encounter (Signed)
Forms completed and given to patient at time of OV

## 2020-04-30 NOTE — Assessment & Plan Note (Signed)
Pt's PE WNL.  UTD on pap, mammo, COVID immunizations.  Reviewed recent labs.  No need to repeat.  Anticipatory guidance provided.

## 2020-04-30 NOTE — Progress Notes (Signed)
   Subjective:    Patient ID: Dawn Evans, female    DOB: 08-08-1976, 44 y.o.   MRN: [id number]  HPI CPE- UTD on pap, mammo, COVID vaccines.  Declines flu shot.  Reviewed past medical, surgical, family and social histories.   Health Maintenance  Topic Date Due  . INFLUENZA VACCINE  12/02/2020 (Originally 04/04/2020)  . Hepatitis C Screening  04/30/2021 (Originally 1976/08/27)  . HIV Screening  04/30/2021 (Originally 10/04/1991)  . MAMMOGRAM  10/30/2020  . PAP SMEAR-Modifier  05/01/2021  . TETANUS/TDAP  04/18/2026  . COVID-19 Vaccine  Completed      Review of Systems Patient reports no vision/ hearing changes, adenopathy,fever, weight change,  persistant/recurrent hoarseness , swallowing issues, chest pain, palpitations, edema, persistant/recurrent cough, hemoptysis, dyspnea (rest/exertional/paroxysmal nocturnal), gastrointestinal bleeding (melena, rectal bleeding), abdominal pain, significant heartburn, bowel changes, GU symptoms (dysuria, hematuria, incontinence), Gyn symptoms (abnormal  bleeding, pain),  syncope, focal weakness, memory loss, numbness & tingling, skin/hair/nail changes, abnormal bruising or bleeding, anxiety, or depression.   This visit occurred during the SARS-CoV-2 public health emergency.  Safety protocols were in place, including screening questions prior to the visit, additional usage of staff PPE, and extensive cleaning of exam room while observing appropriate contact time as indicated for disinfecting solutions.       Objective:   Physical Exam General Appearance:    Alert, cooperative, no distress, appears stated age  Head:    Normocephalic, without obvious abnormality, atraumatic  Eyes:    PERRL, conjunctiva/corneas clear, EOM's intact, fundi    benign, both eyes  Ears:    Normal TM's and external ear canals, both ears  Nose:   Deferred due to COVID  Throat:   Neck:   Supple, symmetrical, trachea midline, no adenopathy;    Thyroid: no  enlargement/tenderness/nodules  Back:     Symmetric, no curvature, ROM normal, no CVA tenderness  Lungs:     Clear to auscultation bilaterally, respirations unlabored  Chest Wall:    No tenderness or deformity   Heart:    Regular rate and rhythm, S1 and S2 normal, no murmur, rub   or gallop  Breast Exam:    Deferred to GYN  Abdomen:     Soft, non-tender, bowel sounds active all four quadrants,    no masses, no organomegaly  Genitalia:    Deferred to GYN  Rectal:    Extremities:   Extremities normal, atraumatic, no cyanosis or edema  Pulses:   2+ and symmetric all extremities  Skin:   Skin color, texture, turgor normal, no rashes or lesions  Lymph nodes:   Cervical, supraclavicular, and axillary nodes normal  Neurologic:   CNII-XII intact, normal strength, sensation and reflexes    throughout          Assessment & Plan:

## 2020-06-10 ENCOUNTER — Ambulatory Visit (INDEPENDENT_AMBULATORY_CARE_PROVIDER_SITE_OTHER): Payer: BC Managed Care – PPO | Admitting: Obstetrics & Gynecology

## 2020-06-10 ENCOUNTER — Encounter: Payer: Self-pay | Admitting: Obstetrics & Gynecology

## 2020-06-10 ENCOUNTER — Other Ambulatory Visit: Payer: Self-pay

## 2020-06-10 VITALS — BP 126/80

## 2020-06-10 DIAGNOSIS — Z30433 Encounter for removal and reinsertion of intrauterine contraceptive device: Secondary | ICD-10-CM

## 2020-06-10 NOTE — Progress Notes (Signed)
    Dawn Evans Feb 21, 1976 [id number]        44 y.o.  G2P2L2  RP: Removal/Insertion of Mirena IUD  HPI: Mirena IUD since October 2016.  No abnormal bleeding.  No pelvic pain.  No vaginal discharge.  Wants to have a new one.   OB History  Gravida Para Term Preterm AB Living  2 2       2   SAB TAB Ectopic Multiple Live Births               # Outcome Date GA Lbr Len/2nd Weight Sex Delivery Anes PTL Lv  2 Para           1 Para             Past medical history,surgical history, problem list, medications, allergies, family history and social history were all reviewed and documented in the EPIC chart.   Directed ROS with pertinent positives and negatives documented in the history of present illness/assessment and plan.  Exam:  Vitals:   06/10/20 1104  BP: 126/80   General appearance:  Normal                                                                    IUD procedure note       Patient presented to the office today for removal and placement of Mirena IUD. The patient had previously been provided with literature information on this method of contraception. The risks benefits and pros and cons were discussed and all her questions were answered. She is fully aware that this form of contraception is 99% effective and is good for 5 years.  Pelvic exam: Vulva normal Vagina: No lesions or discharge Cervix: No lesions or discharge.  String short, but visible.  Easy removal of IUD by pulling on string.  IUD complete/intact. Uterus: AV position Adnexa: No masses or tenderness Rectal exam: Not done  The cervix was cleansed with Betadine solution. Hurricane spray on the cervix.  Os finder used for path of endocervix/mild dilation and hysterometry at 7 cm. The IUD was shown to the patient and inserted in a sterile fashion. The IUD string was trimmed. Patient was instructed to return back to the office in one month for follow up.        Assessment/Plan:  44 y.o. G2P2   1.  Encounter for IUD removal and reinsertion Time to change the Mirena IUD after 5 years.  Easy removal of Mirena IUD.  Insertion of a new Mirena IUD with no complication.  Well-tolerated.  Postprocedure precautions reviewed.  We will follow-up in 4 weeks for IUD check at the same time as her annual gynecologic exam.  [name] MD, 11:06 AM 06/10/2020

## 2020-06-14 ENCOUNTER — Encounter: Payer: Self-pay | Admitting: Anesthesiology

## 2020-06-14 ENCOUNTER — Encounter: Payer: Self-pay | Admitting: Family Medicine

## 2020-06-14 NOTE — Telephone Encounter (Signed)
Sent email to [employer]  Waiting on response.

## 2020-07-08 ENCOUNTER — Other Ambulatory Visit: Payer: Self-pay

## 2020-07-08 ENCOUNTER — Ambulatory Visit (INDEPENDENT_AMBULATORY_CARE_PROVIDER_SITE_OTHER): Payer: BC Managed Care – PPO | Admitting: Obstetrics & Gynecology

## 2020-07-08 ENCOUNTER — Encounter: Payer: Self-pay | Admitting: Obstetrics & Gynecology

## 2020-07-08 VITALS — BP 124/82 | Ht 66.0 in | Wt 153.0 lb

## 2020-07-08 DIAGNOSIS — Z30431 Encounter for routine checking of intrauterine contraceptive device: Secondary | ICD-10-CM | POA: Diagnosis not present

## 2020-07-08 DIAGNOSIS — Z01419 Encounter for gynecological examination (general) (routine) without abnormal findings: Secondary | ICD-10-CM | POA: Diagnosis not present

## 2020-07-08 NOTE — Progress Notes (Signed)
Dawn Evans 01-31-1976 [id number]   History:    44 y.o. G2P2L2 Married.  Son 8th grade, daughter 7th grade.  RP:  Established patient presenting for annual gyn exam   HPI: Well on Mirena IUD, just changed to a new Mirena IUD on 06/10/2020.  Light menses.  No pelvic pain.  No pain with intercourse.  Premenstrual bilateral breast tenderness unchanged.  Breasts normal with no lump. Urine and bowel movements normal.  Body mass index 24.69.  Good fitness, patient is a Physiological scientist, and healthy nutrition.  Health labs with family physician.   Past medical history,surgical history, family history and social history were all reviewed and documented in the EPIC chart.  Gynecologic History Patient's last menstrual period was 07/03/2020.  Obstetric History OB History  Gravida Para Term Preterm AB Living  2 2       2   SAB TAB Ectopic Multiple Live Births               # Outcome Date GA Lbr Len/2nd Weight Sex Delivery Anes PTL Lv  2 Para           1 Para              ROS: A ROS was performed and pertinent positives and negatives are included in the history.  GENERAL: No fevers or chills. HEENT: No change in vision, no earache, sore throat or sinus congestion. NECK: No pain or stiffness. CARDIOVASCULAR: No chest pain or pressure. No palpitations. PULMONARY: No shortness of breath, cough or wheeze. GASTROINTESTINAL: No abdominal pain, nausea, vomiting or diarrhea, melena or bright red blood per rectum. GENITOURINARY: No urinary frequency, urgency, hesitancy or dysuria. MUSCULOSKELETAL: No joint or muscle pain, no back pain, no recent trauma. DERMATOLOGIC: No rash, no itching, no lesions. ENDOCRINE: No polyuria, polydipsia, no heat or cold intolerance. No recent change in weight. HEMATOLOGICAL: No anemia or easy bruising or bleeding. NEUROLOGIC: No headache, seizures, numbness, tingling or weakness. PSYCHIATRIC: No depression, no loss of interest in normal activity or change in sleep  pattern.     Exam:   BP 124/82   Ht 5\' 6"  (1.676 m)   Wt 153 lb (69.4 kg)   LMP 07/03/2020 Comment: MIRENA 06/2020  BMI 24.69 kg/m   Body mass index is 24.69 kg/m.  General appearance : Well developed well nourished female. No acute distress HEENT: Eyes: no retinal hemorrhage or exudates,  Neck supple, trachea midline, no carotid bruits, no thyroidmegaly Lungs: Clear to auscultation, no rhonchi or wheezes, or rib retractions  Heart: Regular rate and rhythm, no murmurs or gallops Breast:Examined in sitting and supine position were symmetrical in appearance, no palpable masses or tenderness,  no skin retraction, no nipple inversion, no nipple discharge, no skin discoloration, no axillary or supraclavicular lymphadenopathy Abdomen: no palpable masses or tenderness, no rebound or guarding Extremities: no edema or skin discoloration or tenderness  Pelvic: Vulva: Normal             Vagina: No gross lesions or discharge  Cervix: No gross lesions or discharge.  IUD strings short, but visible and felt at [facility]  Uterus  AV, normal size, shape and consistency, non-tender and mobile  Adnexa  Without masses or tenderness  Anus: Normal   Assessment/Plan:  44 y.o. female for annual exam   1. Well female exam with routine gynecological exam Normal gynecologic exam.  No indication for Pap test this year.  Breast exam normal.  Screening mammogram February 2021 was negative.  Good body mass index at 24.69.  Continue with fitness and healthy nutrition.  Health labs with family physician.  2. Encounter for routine checking of intrauterine contraceptive device (IUD) Well on Mirena IUD since June 10, 2020.  IUD in good position and no sign of infection.  [name] MD, 11:16 AM 07/08/2020

## 2020-07-09 ENCOUNTER — Encounter: Payer: Self-pay | Admitting: Obstetrics & Gynecology

## 2020-10-15 ENCOUNTER — Other Ambulatory Visit: Payer: Self-pay | Admitting: Family Medicine

## 2020-10-15 DIAGNOSIS — Z1231 Encounter for screening mammogram for malignant neoplasm of breast: Secondary | ICD-10-CM

## 2020-11-16 ENCOUNTER — Ambulatory Visit
Admission: RE | Admit: 2020-11-16 | Discharge: 2020-11-16 | Disposition: A | Discharge status: Home / Self Care | Payer: BC Managed Care – PPO | Source: Ambulatory Visit

## 2020-11-16 ENCOUNTER — Other Ambulatory Visit: Payer: Self-pay

## 2020-11-16 DIAGNOSIS — Z1231 Encounter for screening mammogram for malignant neoplasm of breast: Secondary | ICD-10-CM | POA: Diagnosis not present

## 2020-12-26 ENCOUNTER — Encounter: Payer: Self-pay | Admitting: Family Medicine

## 2020-12-27 MED ORDER — FLUTICASONE PROPIONATE 50 MCG/ACT NA SUSP: 2.0000 | Freq: Every day | NASAL | 3 refills | Status: AC

## 2021-02-03 ENCOUNTER — Encounter: Payer: Self-pay | Admitting: Family Medicine

## 2021-02-17 ENCOUNTER — Encounter: Payer: Self-pay | Admitting: Family Medicine

## 2021-02-23 ENCOUNTER — Other Ambulatory Visit: Payer: Self-pay

## 2021-02-23 ENCOUNTER — Encounter: Payer: Self-pay | Admitting: Obstetrics & Gynecology

## 2021-02-23 ENCOUNTER — Ambulatory Visit (INDEPENDENT_AMBULATORY_CARE_PROVIDER_SITE_OTHER): Payer: BC Managed Care – PPO | Admitting: Obstetrics & Gynecology

## 2021-02-23 ENCOUNTER — Ambulatory Visit (INDEPENDENT_AMBULATORY_CARE_PROVIDER_SITE_OTHER): Payer: BC Managed Care – PPO

## 2021-02-23 VITALS — BP 138/86

## 2021-02-23 DIAGNOSIS — N926 Irregular menstruation, unspecified: Secondary | ICD-10-CM | POA: Diagnosis not present

## 2021-02-23 DIAGNOSIS — D251 Intramural leiomyoma of uterus: Secondary | ICD-10-CM | POA: Diagnosis not present

## 2021-02-23 DIAGNOSIS — Z30431 Encounter for routine checking of intrauterine contraceptive device: Secondary | ICD-10-CM | POA: Diagnosis not present

## 2021-02-23 DIAGNOSIS — D252 Subserosal leiomyoma of uterus: Secondary | ICD-10-CM

## 2021-02-23 DIAGNOSIS — D219 Benign neoplasm of connective and other soft tissue, unspecified: Secondary | ICD-10-CM

## 2021-02-23 NOTE — Progress Notes (Signed)
    Dawn Evans 05/02/1976 [id number]        45 y.o.  G2P2L2  RP: Irregular bleeding x 3 weeks  HPI: Well on Mirena IUD x 06/2020.  Menses regular normal every month until had a late menstrual period 01/31/2021.  UPT Neg then.  Had irregular, at times with menstrual like flow since then, until 4 days ago.  No pelvic pain.  No abnormal vaginal discharge.  No h/o Uterine Fibroids per patient.   OB History  Gravida Para Term Preterm AB Living  2 2       2   SAB IAB Ectopic Multiple Live Births               # Outcome Date GA Lbr Len/2nd Weight Sex Delivery Anes PTL Lv  2 Para           1 Para             Past medical history,surgical history, problem list, medications, allergies, family history and social history were all reviewed and documented in the EPIC chart.   Directed ROS with pertinent positives and negatives documented in the history of present illness/assessment and plan.  Exam:  Vitals:   02/23/21 0803  BP: 138/86   General appearance:  Normal  Abdomen: Normal  Gynecologic exam: Vulva normal.  Speculum:  IUD strings visible at EO.  No blood.  Normal secretions.  Bimanual exam:  Uterus AV, nodular to the left, mobile, NT.  No adnexal mass felt.  Pelvic US today: T/V images.  Anteverted uterus with multiple intramural and subserosal fibroids.  The overall uterine size was measured at 13.02 x 9.07 x 7.38 cm.  10 fibroids were measured, the largest of which was fundal subserosal measured at 5.9 cm.  Thin and symmetrical endometrial lining with no mass or thickening seen.  The endometrial lining was measured at 4.04 mm.  The IUD was noted centrally within the endometrial canal.  Right ovary within normal limits.  Left ovary with 2 simple avascular cyst measured at 2.8 cm and 2.8 cm.  The second cyst might be paratubal.  No adnexal mass seen otherwise.  No free fluid in the posterior cul-de-sac.   Assessment/Plan:  45 y.o. G2P2   1. Irregular menses Irregular menses in  June on Mirena IUD.  Most likely dysfunctional bleeding.  Pelvic ultrasound findings thoroughly reviewed with patient.  Mirena IUD in good intra uterine location.  The endometrial lining is thin at 4.04 mm with no mass or thickening.  Multiple intramural and subserosal fibroids were measured.  The largest of which was fundal subserosal measured at 5.9 cm.  Counseling done on uterine fibroids and patient reassured.  We will repeat a pelvic ultrasound at 6 months to confirm stability of the fibroids.  Will observe the bleeding pattern on with continued Mirena IUD. - US Transvaginal Non-OB  2. Encounter for routine checking of intrauterine contraceptive device (IUD) Mirena IUD in good intra uterine location.  3. Fibroid  New diagnosis of uterine fibroids which are intramural and subserosal.  We will repeat a pelvic ultrasound at 6 months to confirm stability. - US Transvaginal Non-OB; Future   [name] MD, 8:15 AM 02/23/2021

## 2021-03-02 ENCOUNTER — Encounter: Payer: Self-pay | Admitting: *Deleted

## 2021-03-07 ENCOUNTER — Encounter: Payer: Self-pay | Admitting: Family Medicine

## 2021-05-04 ENCOUNTER — Encounter: Payer: BC Managed Care – PPO | Admitting: Family Medicine

## 2021-06-29 ENCOUNTER — Encounter: Payer: Self-pay | Admitting: Family Medicine

## 2021-07-07 ENCOUNTER — Encounter: Payer: BC Managed Care – PPO | Admitting: Family Medicine

## 2021-07-11 ENCOUNTER — Encounter: Payer: Self-pay | Admitting: Family Medicine

## 2021-07-11 ENCOUNTER — Other Ambulatory Visit: Payer: Self-pay

## 2021-07-11 ENCOUNTER — Ambulatory Visit (INDEPENDENT_AMBULATORY_CARE_PROVIDER_SITE_OTHER): Payer: BC Managed Care – PPO | Admitting: Family Medicine

## 2021-07-11 VITALS — BP 116/80 | HR 62 | Temp 98.9°F | Resp 18 | Ht 66.0 in | Wt 151.6 lb

## 2021-07-11 DIAGNOSIS — E559 Vitamin D deficiency, unspecified: Secondary | ICD-10-CM | POA: Diagnosis not present

## 2021-07-11 DIAGNOSIS — Z0000 Encounter for general adult medical examination without abnormal findings: Secondary | ICD-10-CM | POA: Diagnosis not present

## 2021-07-11 LAB — HEPATIC FUNCTION PANEL
ALT: 12 U/L (ref 0–35)
AST: 20 U/L (ref 0–37)
Albumin: 4.7 g/dL (ref 3.5–5.2)
Alkaline Phosphatase: 34 U/L — ABNORMAL LOW (ref 39–117)
Bilirubin, Direct: 0.1 mg/dL (ref 0.0–0.3)
Total Bilirubin: 0.6 mg/dL (ref 0.2–1.2)
Total Protein: 7.3 g/dL (ref 6.0–8.3)

## 2021-07-11 LAB — CBC WITH DIFFERENTIAL/PLATELET
Basophils Absolute: 0 10*3/uL (ref 0.0–0.1)
Basophils Relative: 0.7 % (ref 0.0–3.0)
Eosinophils Absolute: 0.2 10*3/uL (ref 0.0–0.7)
Eosinophils Relative: 3 % (ref 0.0–5.0)
HCT: 43.4 % (ref 36.0–46.0)
Hemoglobin: 14.4 g/dL (ref 12.0–15.0)
Lymphocytes Relative: 24.2 % (ref 12.0–46.0)
Lymphs Abs: 1.6 10*3/uL (ref 0.7–4.0)
MCHC: 33.2 g/dL (ref 30.0–36.0)
MCV: 90.9 fl (ref 78.0–100.0)
Monocytes Absolute: 0.4 10*3/uL (ref 0.1–1.0)
Monocytes Relative: 5.4 % (ref 3.0–12.0)
Neutro Abs: 4.4 10*3/uL (ref 1.4–7.7)
Neutrophils Relative %: 66.7 % (ref 43.0–77.0)
Platelets: 290 10*3/uL (ref 150.0–400.0)
RBC: 4.77 Mil/uL (ref 3.87–5.11)
RDW: 13.2 % (ref 11.5–15.5)
WBC: 6.6 10*3/uL (ref 4.0–10.5)

## 2021-07-11 LAB — LIPID PANEL
Cholesterol: 169 mg/dL (ref 0–200)
HDL: 56 mg/dL (ref >39.00)
LDL Cholesterol: 99 mg/dL (ref 0–99)
NonHDL: 112.85
Total CHOL/HDL Ratio: 3
Triglycerides: 70 mg/dL (ref 0.0–149.0)
VLDL: 14 mg/dL (ref 0.0–40.0)

## 2021-07-11 LAB — BASIC METABOLIC PANEL
BUN: 10 mg/dL (ref 6–23)
CO2: 28 mEq/L (ref 19–32)
Calcium: 9.5 mg/dL (ref 8.4–10.5)
Chloride: 103 mEq/L (ref 96–112)
Creatinine, Ser: 0.73 mg/dL (ref 0.40–1.20)
GFR: 99.77 mL/min (ref >60.00)
Glucose, Bld: 75 mg/dL (ref 70–99)
Potassium: 4.4 mEq/L (ref 3.5–5.1)
Sodium: 138 mEq/L (ref 135–145)

## 2021-07-11 LAB — TSH: TSH: 1.61 u[IU]/mL (ref 0.35–5.50)

## 2021-07-11 LAB — VITAMIN D 25 HYDROXY (VIT D DEFICIENCY, FRACTURES): VITD: 35.98 ng/mL (ref 30.00–100.00)

## 2021-07-11 NOTE — Assessment & Plan Note (Signed)
Pt's PE WNL.  UTD on mammo, Tdap, COVID.  Declines flu.  Pap scheduled for tomorrow.  Check labs.  Anticipatory guidance provided.

## 2021-07-11 NOTE — Assessment & Plan Note (Signed)
Pt has hx of this.  Check labs and replete prn. 

## 2021-07-11 NOTE — Patient Instructions (Addendum)
Follow up in 1 year or as needed We'll notify you of your lab results and make any changes if needed Keep up the good work on healthy diet and regular exercise- you look great! If your shoulder doesn't feel better- let me know! Call with any questions or concerns Happy Holidays!!

## 2021-07-11 NOTE — Progress Notes (Signed)
   Subjective:    Patient ID: Dawn Evans, female    DOB: 05-Sep-1975, 45 y.o.   MRN: [id number]  HPI CPE- UTD on mammo.  Pap is scheduled for tomorrow.  Declines flu.  UTD on Tdap and COVID  Patient Care Team    Relationship Specialty Notifications Start End  Midge Minium, MD PCP - General Family Medicine  10/03/12   [name], MD Consulting Physician Obstetrics and Gynecology  04/16/15     Health Maintenance  Topic Date Due   COVID-19 Vaccine (3 - Booster for Moderna series) 04/08/2021   PAP SMEAR-Modifier  05/01/2021   INFLUENZA VACCINE  12/02/2021 (Originally 04/04/2021)   Hepatitis C Screening  07/11/2022 (Originally 10/03/1994)   HIV Screening  07/11/2022 (Originally 10/04/1991)   MAMMOGRAM  11/16/2021   TETANUS/TDAP  04/18/2026   Pneumococcal Vaccine 31-46 Years old  Aged Out   HPV VACCINES  Aged Out      Review of Systems Patient reports no vision/ hearing changes, adenopathy,fever, weight change,  persistant/recurrent hoarseness , swallowing issues, chest pain, palpitations, edema, persistant/recurrent cough, hemoptysis, dyspnea (rest/exertional/paroxysmal nocturnal), gastrointestinal bleeding (melena, rectal bleeding), abdominal pain, significant heartburn, bowel changes, GU symptoms (dysuria, hematuria, incontinence), Gyn symptoms (pain),  syncope, focal weakness, memory loss, numbness & tingling, skin/hair/nail changes, abnormal bruising, anxiety, or depression.   L shoulder pain- pt reports she has had pain since getting her COVID booster.  Some decreased ROM.  Has deep tissue massage today and if that doesn't improve, she will message about PT.  + abnormal vaginal bleeding- fibroids discovered June, following w/ GYN  This visit occurred during the SARS-CoV-2 public health emergency.  Safety protocols were in place, including screening questions prior to the visit, additional usage of staff PPE, and extensive cleaning of exam room while observing appropriate  contact time as indicated for disinfecting solutions.      Objective:   Physical Exam General Appearance:    Alert, cooperative, no distress, appears stated age  Head:    Normocephalic, without obvious abnormality, atraumatic  Eyes:    PERRL, conjunctiva/corneas clear, EOM's intact, fundi    benign, both eyes  Ears:    Normal TM's and external ear canals, both ears  Nose:   Deferred due to COVID  Throat:   Neck:   Supple, symmetrical, trachea midline, no adenopathy;    Thyroid: no enlargement/tenderness/nodules  Back:     Symmetric, no curvature, ROM normal, no CVA tenderness  Lungs:     Clear to auscultation bilaterally, respirations unlabored  Chest Wall:    No tenderness or deformity   Heart:    Regular rate and rhythm, S1 and S2 normal, no murmur, rub   or gallop  Breast Exam:    Deferred to GYN  Abdomen:     Soft, non-tender, bowel sounds active all four quadrants,    no masses, no organomegaly  Genitalia:    Deferred to GYN  Rectal:    Extremities:   Extremities normal, atraumatic, no cyanosis or edema  Pulses:   2+ and symmetric all extremities  Skin:   Skin color, texture, turgor normal, no rashes or lesions  Lymph nodes:   Cervical, supraclavicular, and axillary nodes normal  Neurologic:   CNII-XII intact, normal strength, sensation and reflexes    throughout          Assessment & Plan:

## 2021-07-12 ENCOUNTER — Ambulatory Visit (INDEPENDENT_AMBULATORY_CARE_PROVIDER_SITE_OTHER): Payer: BC Managed Care – PPO | Admitting: Obstetrics & Gynecology

## 2021-07-12 ENCOUNTER — Encounter: Payer: Self-pay | Admitting: Obstetrics & Gynecology

## 2021-07-12 ENCOUNTER — Other Ambulatory Visit (HOSPITAL_COMMUNITY)
Admission: RE | Admit: 2021-07-12 | Discharge: 2021-07-12 | Disposition: A | Discharge status: Home / Self Care | Payer: BC Managed Care – PPO | Source: Ambulatory Visit | Attending: Obstetrics & Gynecology | Admitting: Obstetrics & Gynecology

## 2021-07-12 VITALS — BP 120/78 | HR 72 | Resp 16 | Ht 66.0 in | Wt 151.0 lb

## 2021-07-12 DIAGNOSIS — D219 Benign neoplasm of connective and other soft tissue, unspecified: Secondary | ICD-10-CM | POA: Diagnosis not present

## 2021-07-12 DIAGNOSIS — Z30431 Encounter for routine checking of intrauterine contraceptive device: Secondary | ICD-10-CM

## 2021-07-12 DIAGNOSIS — Z01419 Encounter for gynecological examination (general) (routine) without abnormal findings: Secondary | ICD-10-CM

## 2021-07-12 DIAGNOSIS — N920 Excessive and frequent menstruation with regular cycle: Secondary | ICD-10-CM | POA: Diagnosis not present

## 2021-07-12 NOTE — Progress Notes (Addendum)
Dawn Evans Oct 16, 1975 [id number]   History:    45 y.o.  G2P2L2 Married.  Son 9th grade, daughter 8th grade.   RP:  Established patient presenting for annual gyn exam    HPI: Mirena IUD x 06/10/2020.  Heavy menses with severe cramps lasting 7-8 days.  Periods are every 25-35 days.  No BTB.  Pelvic US 02/2021 showed fibroids.  No pain with intercourse.  Pap 2019 Neg.  Premenstrual bilateral breast tenderness unchanged.  Breasts normal with no lump. Screening mammo 11/2020 Neg.  Urine and bowel movements normal.  Body mass index 24.37.  Good fitness, patient is a Physiological scientist, and healthy nutrition.  Health labs with family physician 07/11/2021, Hb 14.4.   Past medical history,surgical history, family history and social history were all reviewed and documented in the EPIC chart.  Gynecologic History No LMP recorded. (Menstrual status: IUD).  Obstetric History OB History  Gravida Para Term Preterm AB Living  2 2       2   SAB IAB Ectopic Multiple Live Births               # Outcome Date GA Lbr Len/2nd Weight Sex Delivery Anes PTL Lv  2 Para           1 Para              ROS: A ROS was performed and pertinent positives and negatives are included in the history.  GENERAL: No fevers or chills. HEENT: No change in vision, no earache, sore throat or sinus congestion. NECK: No pain or stiffness. CARDIOVASCULAR: No chest pain or pressure. No palpitations. PULMONARY: No shortness of breath, cough or wheeze. GASTROINTESTINAL: No abdominal pain, nausea, vomiting or diarrhea, melena or bright red blood per rectum. GENITOURINARY: No urinary frequency, urgency, hesitancy or dysuria. MUSCULOSKELETAL: No joint or muscle pain, no back pain, no recent trauma. DERMATOLOGIC: No rash, no itching, no lesions. ENDOCRINE: No polyuria, polydipsia, no heat or cold intolerance. No recent change in weight. HEMATOLOGICAL: No anemia or easy bruising or bleeding. NEUROLOGIC: No headache, seizures, numbness,  tingling or weakness. PSYCHIATRIC: No depression, no loss of interest in normal activity or change in sleep pattern.     Exam:   BP 120/78   Pulse 72   Resp 16   Ht 5\' 6"  (1.676 m)   Wt 151 lb (68.5 kg)   BMI 24.37 kg/m   Body mass index is 24.37 kg/m.  General appearance : Well developed well nourished female. No acute distress HEENT: Eyes: no retinal hemorrhage or exudates,  Neck supple, trachea midline, no carotid bruits, no thyroidmegaly Lungs: Clear to auscultation, no rhonchi or wheezes, or rib retractions  Heart: Regular rate and rhythm, no murmurs or gallops Breast:Examined in sitting and supine position were symmetrical in appearance, no palpable masses or tenderness,  no skin retraction, no nipple inversion, no nipple discharge, no skin discoloration, no axillary or supraclavicular lymphadenopathy Abdomen: no palpable masses or tenderness, no rebound or guarding Extremities: no edema or skin discoloration or tenderness  Pelvic: Vulva: Normal             Vagina: No gross lesions or discharge  Cervix: No gross lesions or discharge.  Pap reflex done.  [facility]UD strings felt at [facility].  Uterus  AV, increased in size and nodular, non-tender and mobile  Adnexa  Without masses or tenderness  Anus: Normal  Pelvic US 02/2021: T/V images.  Anteverted uterus with multiple intramural and subserosal fibroids.  The  overall uterine size was measured at 13.02 x 9.07 x 7.38 cm.  10 fibroids were measured, the largest of which was fundal subserosal measured at 5.9 cm.  Thin and symmetrical endometrial lining with no mass or thickening seen.  The endometrial lining was measured at 4.04 mm.  The IUD was noted centrally within the endometrial canal.  Right ovary within normal limits.  Left ovary with 2 simple avascular cyst measured at 2.8 cm and 2.8 cm.  The second cyst might be paratubal.  No adnexal mass seen otherwise.  No free fluid in the posterior cul-de-sac.    Assessment/Plan:  45 y.o. female  for annual exam   1. Encounter for routine gynecological examination with Papanicolaou smear of cervix Mirena IUD x 06/10/2020.  Heavy menses with severe cramps lasting 7-8 days.  Periods are every 25-35 days.  No BTB.  Pelvic US 02/2021 showed fibroids.  No pain with intercourse.  Pap 2019 Neg.  Premenstrual bilateral breast tenderness unchanged.  Breasts normal with no lump. Screening mammo 11/2020 Neg.  Urine and bowel movements normal.  Body mass index 24.37.  Good fitness, patient is a Physiological scientist, and healthy nutrition.  Health labs with family physician 07/11/2021, Hb 14.4. -Pap reflex  2. Encounter for routine checking of intrauterine contraceptive device (IUD) IUD strings felt at [facility].  3. Fibroid Worsening heavy bleeding and pelvic cramps/dysmeno associated with Fibroids.  F/U Pelvic US and decision on management per results. - US Transvaginal Non-OB; Future  4. Menorrhagia with regular cycle Hb 14.4 yesterday. - US Transvaginal Non-OB; Future   [name] MD, 10:28 AM 07/12/2021

## 2021-07-12 NOTE — Addendum Note (Signed)
Addended by: [name] on: 07/12/2021 12:53 PM   Modules accepted: Orders

## 2021-07-15 LAB — CYTOLOGY - PAP
Diagnosis: NEGATIVE
Diagnosis: REACTIVE

## 2021-07-27 ENCOUNTER — Encounter: Payer: Self-pay | Admitting: Family Medicine

## 2021-07-27 NOTE — Telephone Encounter (Signed)
Can schedule during any availability, any provider next week or see urgent care if deteriorating -  Thanks,  Denice Paradise

## 2021-08-04 ENCOUNTER — Ambulatory Visit (INDEPENDENT_AMBULATORY_CARE_PROVIDER_SITE_OTHER): Payer: BC Managed Care – PPO

## 2021-08-04 ENCOUNTER — Telehealth: Payer: Self-pay

## 2021-08-04 ENCOUNTER — Ambulatory Visit (INDEPENDENT_AMBULATORY_CARE_PROVIDER_SITE_OTHER): Payer: BC Managed Care – PPO | Admitting: Obstetrics & Gynecology

## 2021-08-04 ENCOUNTER — Other Ambulatory Visit: Payer: Self-pay

## 2021-08-04 ENCOUNTER — Encounter: Payer: Self-pay | Admitting: Obstetrics & Gynecology

## 2021-08-04 VITALS — BP 120/78

## 2021-08-04 DIAGNOSIS — D219 Benign neoplasm of connective and other soft tissue, unspecified: Secondary | ICD-10-CM

## 2021-08-04 DIAGNOSIS — R1030 Lower abdominal pain, unspecified: Secondary | ICD-10-CM

## 2021-08-04 DIAGNOSIS — N920 Excessive and frequent menstruation with regular cycle: Secondary | ICD-10-CM | POA: Diagnosis not present

## 2021-08-04 NOTE — Progress Notes (Addendum)
    Dawn Evans 01-Aug-1976 [id number]        45 y.o.  G2P2L2  RP: Sxic Uterine Fibroids for f/u Pelvic US  HPI: Seen for Annual Gyn exam 07/12/2021 when we noted: Mirena IUD x 06/10/2020.  Heavy menses with severe cramps lasting 7-8 days.  Periods are every 25-35 days.  No BTB.  Pelvic US 02/2021 showed fibroids.  No pain with intercourse.     OB History  Gravida Para Term Preterm AB Living  2 2       2   SAB IAB Ectopic Multiple Live Births               # Outcome Date GA Lbr Len/2nd Weight Sex Delivery Anes PTL Lv  2 Para           1 Para             Past medical history,surgical history, problem list, medications, allergies, family history and social history were all reviewed and documented in the EPIC chart.   Directed ROS with pertinent positives and negatives documented in the history of present illness/assessment and plan.  Exam:  Vitals:   08/04/21 1033  BP: 120/78   General appearance:  Normal  Pelvic US today: T/V images.  Comparison is made to previous scan on February 23, 2021.  Anteverted enlarged uterus with multiple intramural and subserosal fibroids again noted today.  Largest fibroid is measured at 6.2 x 6.6 cm, previously measured at 5.8 x 5.6 cm.  The overall uterine size is measured at 10.9 x 7.97 x 8.55 cm.  Symmetrical endometrium with the IUD centrally located within the cavity with both arms extended.  No endometrial mass or thickening seen.  Left ovary within normal limits, previously seen cyst has completely resolved.  Right ovary with a 3.8 x 2.8 cm avascular cyst noted.  Small amount of free fluid is seen around the left adnexa.   Hb 07/11/2021 at 14.4   Assessment/Plan:  45 y.o. G2P2   1. Lower abdominal pain Seen for Annual Gyn exam 07/12/2021 when we noted: Mirena IUD x 06/10/2020.  Heavy menses with severe cramps lasting 7-8 days.  Periods are every 25-35 days.  No BTB.  Pelvic US 02/2021 showed fibroids.  No pain with intercourse.  Pelvic US  thoroughly reviewed with patient.  Mild increase in size of fibroids.  Mirena IUD in good IU position and normal endometrium with no mass or thickening. Left ovary normal.  Rt ovary with avascular simple cyst, functional cyst, at 3.8 cm.  Offered the Progestin pill to control ovulatory cysts by blocking ovulation.  Medical/hormonal treatments declined by patient, desires definitive surgical management.  Counseling done of different approaches.  Decision to proceed with XI Robotic TLH/BS.  Surgery and risks reviewed.  Pamphlet given.  F/U preop visit.  2. Fibroids Mild increase in size.   3. Menorrhagia with regular cycle  Heavy menses on Mirena IUD.  No secondary anemia.  Hb 14.4 on 07/11/2021.  Decision to proceed with XI Robotic TLH/BS.   [name] MD, 10:58 AM 08/04/2021

## 2021-08-04 NOTE — Telephone Encounter (Signed)
-----   Message from [name], MD sent at 08/04/2021 11:01 AM EST ----- Regarding: Schedule Surgery Surgery: XI Robotic Total Laparoscopic Hysterectomy with Bilateral Salpingectomy  Diagnosis: Enlarged symptomatic Uterine Fibroids with pain and menorrhagia  Location: [facility]  Status: Outpatient  Time: 29 Minutes  Assistant: First Available Provider  Urgency: First Available/Would prefer December 2022  Pre-Op Appointment: To Be Scheduled  Post-Op Appointment(s): 2 Weeks, 6 Weeks  Time Out Of Work: 6 Weeks

## 2021-08-05 ENCOUNTER — Encounter: Payer: Self-pay | Admitting: Obstetrics & Gynecology

## 2021-08-05 NOTE — Telephone Encounter (Signed)
Spoke with patient. Reviewed surgery dates. Patient will return call today to confirm surgery date that she would like to proceed after speaking with her spouse.  Advised patient I will forward to business office for return call.. Patient verbalizes understanding and is agreeable.

## 2021-08-08 NOTE — Telephone Encounter (Signed)
Spoke with patient. Surgery date request confirmed.  Advised surgery is scheduled for 08/22/21, [facility] at 1330.  Surgery instruction sheet and hospital brochure reviewed, printed copy will be mailed.Patient advised if Covid screening and quarantine requirements and agreeable.   Routing to [employer]

## 2021-08-09 NOTE — Telephone Encounter (Signed)
Spoke with patient regarding surgery benefits. Patient acknowledges understanding of information presented. Patient is aware that benefits presented are professional benefits only. Patient is aware the hospital will call with facility benefits. See account note.  Encounter closed.

## 2021-08-10 ENCOUNTER — Encounter: Payer: Self-pay | Admitting: Obstetrics & Gynecology

## 2021-08-12 ENCOUNTER — Ambulatory Visit (INDEPENDENT_AMBULATORY_CARE_PROVIDER_SITE_OTHER): Payer: BC Managed Care – PPO | Admitting: Obstetrics & Gynecology

## 2021-08-12 ENCOUNTER — Other Ambulatory Visit: Payer: Self-pay

## 2021-08-12 ENCOUNTER — Encounter: Payer: Self-pay | Admitting: Obstetrics & Gynecology

## 2021-08-12 VITALS — BP 124/80 | HR 56 | Resp 16 | Ht 66.0 in | Wt 152.0 lb

## 2021-08-12 DIAGNOSIS — N920 Excessive and frequent menstruation with regular cycle: Secondary | ICD-10-CM

## 2021-08-12 DIAGNOSIS — R1030 Lower abdominal pain, unspecified: Secondary | ICD-10-CM | POA: Diagnosis not present

## 2021-08-12 DIAGNOSIS — D219 Benign neoplasm of connective and other soft tissue, unspecified: Secondary | ICD-10-CM | POA: Diagnosis not present

## 2021-08-12 NOTE — Progress Notes (Signed)
Karter Broaddus 1976/04/06 [id number]        45 y.o.  G2P2L2 Accompanied by husband  RP: Preop XI Robotic TLH/BS on 08/22/2021  HPI: Pelvic US on 08/04/2021: Mirena IUD x 06/10/2020.  Heavy menses with severe cramps lasting 7-8 days.  Periods are every 25-35 days.  No BTB.  Pelvic US 02/2021 showed fibroids.  No pain with intercourse.     OB History  Gravida Para Term Preterm AB Living  2 2       2   SAB IAB Ectopic Multiple Live Births               # Outcome Date GA Lbr Len/2nd Weight Sex Delivery Anes PTL Lv  2 Para           1 Para             Past medical history,surgical history, problem list, medications, allergies, family history and social history were all reviewed and documented in the EPIC chart.   Directed ROS with pertinent positives and negatives documented in the history of present illness/assessment and plan.  Exam:  Vitals:   08/12/21 1349  BP: 124/80  Pulse: (!) 56  Resp: 16  Weight: 152 lb (68.9 kg)  Height: 5\' 6"  (1.676 m)   General appearance:  Normal  Pelvic US on 08/04/2021: T/V images.  Comparison is made to previous scan on February 23, 2021.  Anteverted enlarged uterus with multiple intramural and subserosal fibroids again noted today.  Largest fibroid is measured at 6.2 x 6.6 cm, previously measured at 5.8 x 5.6 cm.  The overall uterine size is measured at 10.9 x 7.97 x 8.55 cm.  Symmetrical endometrium with the IUD centrally located within the cavity with both arms extended.  No endometrial mass or thickening seen.  Left ovary within normal limits, previously seen cyst has completely resolved.  Right ovary with a 3.8 x 2.8 cm avascular cyst noted.  Small amount of free fluid is seen around the left adnexa.     Hb 07/11/2021 at 14.4     Assessment/Plan:  45 y.o. G2P2    1. Lower abdominal pain Mirena IUD x 06/10/2020.  Heavy menses with severe cramps lasting 7-8 days.  Periods are every 25-35 days.  No BTB.  Pelvic US 02/2021 showed fibroids.  No pain  with intercourse.  Pelvic US from 08/04/2021 thoroughly reviewed with patient.  Mild increase in size of fibroids.  Mirena IUD in good IU position and normal endometrium with no mass or thickening. Left ovary normal.  Rt ovary with avascular simple cyst, functional cyst, at 3.8 cm.  Offered the Progestin pill to control ovulatory cysts by blocking ovulation.  Medical/hormonal treatments declined by patient, desires definitive surgical management.  Counseling done of different approaches.  Decision to proceed with XI Robotic TLH/BS.  Surgery and risks thoroughly reviewed as well as preop preparation and postop precautions and expectations.   2. Fibroids Mild increase in size.    3. Menorrhagia with regular cycle  Heavy menses on Mirena IUD.  No secondary anemia.  Hb 14.4 on 07/11/2021.  Decision to proceed with XI Robotic TLH/BS.                         Patient was counseled as to the risk of surgery to include the following:  1. Infection (prohylactic antibiotics will be administered)  2. DVT/Pulmonary Embolism (prophylactic pneumo compression stockings will be used)  3.Trauma to  internal organs requiring additional surgical procedure to repair any injury to internal organs requiring perhaps additional hospitalization days.  4.Hemmorhage requiring transfusion and blood products which carry risks such as  anaphylactic reaction, hepatitis and AIDS  Patient had received literature information on the procedure scheduled and all her questions were answered and fully accepts all risk.    [name] MD, 2:29 PM 08/12/2021

## 2021-08-13 ENCOUNTER — Encounter: Payer: Self-pay | Admitting: Obstetrics & Gynecology

## 2021-08-15 ENCOUNTER — Encounter: Payer: Self-pay | Admitting: Family Medicine

## 2021-08-16 ENCOUNTER — Encounter (HOSPITAL_BASED_OUTPATIENT_CLINIC_OR_DEPARTMENT_OTHER): Payer: Self-pay | Admitting: Obstetrics & Gynecology

## 2021-08-16 ENCOUNTER — Other Ambulatory Visit: Payer: Self-pay

## 2021-08-16 DIAGNOSIS — Z01812 Encounter for preprocedural laboratory examination: Secondary | ICD-10-CM | POA: Diagnosis not present

## 2021-08-16 NOTE — Progress Notes (Signed)
Spoke w/ via phone for pre-op interview---pt Lab needs dos----  urine preg             Lab results------lab appt 08-18-2021 1000 am for cbc t & s COVID test -----patient states asymptomatic no test needed Arrive at -------1130 am 08-22-2021 NPO after MN NO Solid Food.  Clear liquids from MN until---1030 am Med rec completed Medications to take morning of surgery -----certrizine prn, flonase prn Diabetic medication -----n/a Patient instructed no nail polish to be worn day of surgery Patient instructed to bring photo id and insurance card day of surgery Patient aware to have Driver (ride ) / caregiver    for 24 hours after surgery  spouse Dawn Evans will stay Patient Special Instructions -----pt given extended recovery instructions Pre-Op special Istructions -----none Patient verbalized understanding of instructions that were given at this phone interview. Patient denies shortness of breath, chest pain, fever, cough at this phone interview.

## 2021-08-16 NOTE — Progress Notes (Signed)
PLEASE WEAR A MASK OUT IN PUBLIC AND SOCIAL DISTANCE AND [place] YOUR HANDS FREQUENTLY. PLEASE ASK ALL YOUR CLOSE HOUSEHOLD CONTACT TO WEAR MASK OUT IN PUBLIC AND SOCIAL DISTANCE AND [place] HANDS FREQUENTLY ALSO.      Your procedure is scheduled on 08-22-2021  Report to [facility].   Call this number if you have problems the morning of surgery  :[phone].   OUR ADDRESS IS [address].  WE ARE LOCATED IN [facility].  PLEASE BRING YOUR INSURANCE CARD AND PHOTO ID DAY OF SURGERY.  ONLY ONE PERSON ALLOWED IN FACILITY WAITING AREA.                                     REMEMBER:  DO NOT EAT FOOD, CANDY GUM OR MINTS  AFTER MIDNIGHT . YOU MAY HAVE CLEAR LIQUIDS FROM MIDNIGHT UNTIL 1030 AM. NO CLEAR LIQUIDS AFTER 1030 AM DAY OF SURGERY.   YOU MAY  BRUSH YOUR TEETH MORNING OF SURGERY AND RINSE YOUR MOUTH OUT, NO CHEWING GUM CANDY OR MINTS.    CLEAR LIQUID DIET   Foods Allowed                                                                     Foods Excluded  Coffee and tea, regular and decaf                             liquids that you cannot  Plain Jell-O any favor except red or purple                                           see through such as: Fruit ices (not with fruit pulp)                                     milk, soups, orange juice  Iced Popsicles                                    All solid food Carbonated beverages, regular and diet                                    Cranberry, grape and apple juices Sports drinks like Gatorade Lightly seasoned clear broth or consume(fat free) Sugar  Sample Menu Breakfast                                Lunch                                     Supper Cranberry  juice                                           Jell-O                                     Grape juice                           Apple juice Coffee or tea                        Jell-O                                       Popsicle                                                Coffee or tea                        Coffee or tea  _____________________________________________________________________     TAKE THESE MEDICATIONS MORNING OF SURGERY WITH A SIP OF WATER:  CERTRIZINE ALLERGY PILL IF NEEDED, FLONASE NASAL SPRAY IF NEEDED.  ONE VISITOR IS ALLOWED IN WAITING ROOM ONLY DAY OF SURGERY.  YOU MAY HAVE ANOTHER PERSON SWITCH OUT WITH THE  1  VISITOR IN THE WAITING ROOM DAY OF SURGERY AND A MASK MUST BE WORN IN THE WAITING ROOM.    2 VISITORS  MAY VISIT IN THE EXTENDED RECOVERY ROOM UNTIL 800 PM ONLY 1 VISITOR AGE 66 AND OVER MAY SPEND THE NIGHT AND MUST BE IN EXTENDED RECOVERY ROOM NO LATER THAN 800 PM .    ALL PERSONS VISITING IN EXTENDED RECOVERY ROOM MUST WEAR A MASK.                                    DO NOT WEAR JEWERLY, MAKE UP. DO NOT WEAR LOTIONS, POWDERS, PERFUMES OR NAIL POLISH ON YOUR FINGERNAILS. TOENAIL POLISH IS OK TO WEAR. DO NOT SHAVE FOR 48 HOURS PRIOR TO DAY OF SURGERY. MEN MAY SHAVE FACE AND NECK. CONTACTS, GLASSES, OR DENTURES MAY NOT BE WORN TO SURGERY.                                    [facility] IS NOT RESPONSIBLE  FOR ANY BELONGINGS.                                                                    Marland Kitchen           [place] - Preparing for Surgery Before surgery, you can play an important role.  Because skin is not sterile, your skin needs to be as free of germs as possible.  You can reduce the number of germs on your skin by washing with CHG (chlorahexidine gluconate) soap before surgery.  CHG is an antiseptic cleaner which kills germs and bonds with the skin to continue killing germs even after washing. Please DO NOT use if you have an allergy to CHG or antibacterial soaps.  If your skin becomes reddened/irritated stop using the CHG and inform your nurse when you arrive at Short Stay. Do not shave (including legs and underarms) for at least 48 hours prior to the first CHG shower.  You  may shave your face/neck. Please follow these instructions carefully:  1.  Shower with CHG Soap the night before surgery and the  morning of Surgery.  2.  If you choose to wash your hair, wash your hair first as usual with your  normal  shampoo.  3.  After you shampoo, rinse your hair and body thoroughly to remove the  shampoo.                            4.  Use CHG as you would any other liquid soap.  You can apply chg directly  to the skin and wash                      Gently with a scrungie or clean washcloth.  5.  Apply the CHG Soap to your body ONLY FROM THE NECK DOWN.   Do not use on face/ open                           Wound or open sores. Avoid contact with eyes, ears mouth and genitals (private parts).                       Wash face,  Genitals (private parts) with your normal soap.             6.  Wash thoroughly, paying special attention to the area where your surgery  will be performed.  7.  Thoroughly rinse your body with warm water from the neck down.  8.  DO NOT shower/wash with your normal soap after using and rinsing off  the CHG Soap.                9.  Pat yourself dry with a clean towel.            10.  Wear clean pajamas.            11.  Place clean sheets on your bed the night of your first shower and do not  sleep with pets. Day of Surgery : Do not apply any lotions/deodorants the morning of surgery.  Please wear clean clothes to the hospital/surgery center.  IF YOU HAVE ANY SKIN IRRITATION OR PROBLEMS WITH THE SURGICAL SOAP, PLEASE GET A BAR OF GOLD DIAL SOAP AND SHOWER THE NIGHT BEFORE YOUR SURGERY AND THE MORNING OF YOUR SURGERY. PLEASE LET THE NURSE KNOW MORNING OF YOUR SURGERY IF YOU HAD ANY PROBLEMS WITH THE SURGICAL SOAP.  FAILURE TO FOLLOW THESE INSTRUCTIONS MAY RESULT IN THE CANCELLATION OF YOUR SURGERY PATIENT SIGNATURE_________________________________  NURSE  SIGNATURE__________________________________  ________________________________________________________________________  QUESTIONS CALL Rosmary Dionisio PRE OP NURSE PHONE [phone].

## 2021-08-18 ENCOUNTER — Other Ambulatory Visit: Payer: Self-pay

## 2021-08-18 ENCOUNTER — Encounter (HOSPITAL_COMMUNITY)
Admission: RE | Admit: 2021-08-18 | Discharge: 2021-08-18 | Disposition: A | Discharge status: Home / Self Care | Payer: BC Managed Care – PPO | Source: Ambulatory Visit | Attending: Obstetrics & Gynecology | Admitting: Obstetrics & Gynecology

## 2021-08-18 DIAGNOSIS — Z01818 Encounter for other preprocedural examination: Secondary | ICD-10-CM

## 2021-08-18 DIAGNOSIS — Z01812 Encounter for preprocedural laboratory examination: Secondary | ICD-10-CM | POA: Diagnosis not present

## 2021-08-18 LAB — CBC
HCT: 43.7 % (ref 36.0–46.0)
Hemoglobin: 14.3 g/dL (ref 12.0–15.0)
MCH: 30 pg (ref 26.0–34.0)
MCHC: 32.7 g/dL (ref 30.0–36.0)
MCV: 91.8 fL (ref 80.0–100.0)
Platelets: 260 10*3/uL (ref 150–400)
RBC: 4.76 MIL/uL (ref 3.87–5.11)
RDW: 13 % (ref 11.5–15.5)
WBC: 7.1 10*3/uL (ref 4.0–10.5)
nRBC: 0 % (ref 0.0–0.2)

## 2021-08-22 ENCOUNTER — Ambulatory Visit (HOSPITAL_BASED_OUTPATIENT_CLINIC_OR_DEPARTMENT_OTHER)
Admission: RE | Admit: 2021-08-22 | Discharge: 2021-08-23 | Disposition: A | Discharge status: Home / Self Care | Payer: BC Managed Care – PPO | Attending: Obstetrics & Gynecology | Admitting: Obstetrics & Gynecology

## 2021-08-22 ENCOUNTER — Ambulatory Visit (HOSPITAL_BASED_OUTPATIENT_CLINIC_OR_DEPARTMENT_OTHER): Payer: BC Managed Care – PPO | Admitting: Anesthesiology

## 2021-08-22 ENCOUNTER — Encounter (HOSPITAL_BASED_OUTPATIENT_CLINIC_OR_DEPARTMENT_OTHER)
Admission: RE | Disposition: A | Discharge status: Home / Self Care | Payer: Self-pay | Source: Home / Self Care | Attending: Obstetrics & Gynecology

## 2021-08-22 ENCOUNTER — Encounter (HOSPITAL_BASED_OUTPATIENT_CLINIC_OR_DEPARTMENT_OTHER): Payer: Self-pay | Admitting: Obstetrics & Gynecology

## 2021-08-22 DIAGNOSIS — N838 Other noninflammatory disorders of ovary, fallopian tube and broad ligament: Secondary | ICD-10-CM | POA: Insufficient documentation

## 2021-08-22 DIAGNOSIS — D259 Leiomyoma of uterus, unspecified: Secondary | ICD-10-CM | POA: Insufficient documentation

## 2021-08-22 DIAGNOSIS — Z9889 Other specified postprocedural states: Secondary | ICD-10-CM

## 2021-08-22 DIAGNOSIS — N920 Excessive and frequent menstruation with regular cycle: Secondary | ICD-10-CM

## 2021-08-22 DIAGNOSIS — D251 Intramural leiomyoma of uterus: Secondary | ICD-10-CM | POA: Diagnosis not present

## 2021-08-22 DIAGNOSIS — R1030 Lower abdominal pain, unspecified: Secondary | ICD-10-CM

## 2021-08-22 DIAGNOSIS — Z01818 Encounter for other preprocedural examination: Secondary | ICD-10-CM

## 2021-08-22 DIAGNOSIS — D219 Benign neoplasm of connective and other soft tissue, unspecified: Secondary | ICD-10-CM

## 2021-08-22 HISTORY — PX: ROBOTIC ASSISTED LAPAROSCOPIC HYSTERECTOMY AND SALPINGECTOMY: SHX6379

## 2021-08-22 HISTORY — DX: Unspecified osteoarthritis, unspecified site: M19.90

## 2021-08-22 HISTORY — DX: Other complications of anesthesia, initial encounter: T88.59XA

## 2021-08-22 HISTORY — DX: Other specified postprocedural states: Z98.890

## 2021-08-22 HISTORY — DX: Family history of other specified conditions: Z84.89

## 2021-08-22 HISTORY — DX: Headache, unspecified: R51.9

## 2021-08-22 HISTORY — DX: Nausea with vomiting, unspecified: R11.2

## 2021-08-22 HISTORY — PX: LAPAROSCOPIC HYSTERECTOMY: SHX1926

## 2021-08-22 LAB — TYPE AND SCREEN
ABO/RH(D): O POS
Antibody Screen: NEGATIVE

## 2021-08-22 LAB — ABO/RH: ABO/RH(D): O POS

## 2021-08-22 LAB — POCT PREGNANCY, URINE: Preg Test, Ur: NEGATIVE

## 2021-08-22 SURGERY — XI ROBOTIC ASSISTED LAPAROSCOPIC HYSTERECTOMY AND SALPINGECTOMY
Anesthesia: General | Site: Abdomen | Laterality: Bilateral

## 2021-08-22 MED ORDER — DEXAMETHASONE SODIUM PHOSPHATE 10 MG/ML IJ SOLN
INTRAMUSCULAR | Status: AC
  Filled 2021-08-22: qty 1

## 2021-08-22 MED ORDER — ROCURONIUM BROMIDE 10 MG/ML (PF) SYRINGE
PREFILLED_SYRINGE | INTRAVENOUS | Status: DC | PRN
  Administered 2021-08-22: 20 mg via INTRAVENOUS
  Administered 2021-08-22: 60 mg via INTRAVENOUS
  Administered 2021-08-22 (×3): 20 mg via INTRAVENOUS
  Administered 2021-08-22: 10 mg via INTRAVENOUS

## 2021-08-22 MED ORDER — LIDOCAINE 2% (20 MG/ML) 5 ML SYRINGE
INTRAMUSCULAR | Status: DC | PRN
  Administered 2021-08-22: 80 mg via INTRAVENOUS

## 2021-08-22 MED ORDER — HYDROMORPHONE HCL 1 MG/ML IJ SOLN
INTRAMUSCULAR | Status: AC
  Filled 2021-08-22: qty 1

## 2021-08-22 MED ORDER — MIDAZOLAM HCL 2 MG/2ML IJ SOLN
INTRAMUSCULAR | Status: AC
  Filled 2021-08-22: qty 2

## 2021-08-22 MED ORDER — PROPOFOL 1000 MG/100ML IV EMUL
INTRAVENOUS | Status: AC
  Filled 2021-08-22: qty 100

## 2021-08-22 MED ORDER — PROPOFOL 10 MG/ML IV BOLUS
INTRAVENOUS | Status: AC
  Filled 2021-08-22: qty 20

## 2021-08-22 MED ORDER — FENTANYL CITRATE (PF) 100 MCG/2ML IJ SOLN
25.0000 ug | INTRAMUSCULAR | Status: DC | PRN
  Administered 2021-08-22 (×3): 50 ug via INTRAVENOUS

## 2021-08-22 MED ORDER — ONDANSETRON HCL 4 MG/2ML IJ SOLN: 4.0000 mg | Freq: Four times a day (QID) | INTRAMUSCULAR | Status: DC

## 2021-08-22 MED ORDER — ONDANSETRON HCL 4 MG/2ML IJ SOLN
INTRAMUSCULAR | Status: AC
  Filled 2021-08-22: qty 2

## 2021-08-22 MED ORDER — MIDAZOLAM HCL 5 MG/5ML IJ SOLN
INTRAMUSCULAR | Status: DC | PRN
  Administered 2021-08-22: 2 mg via INTRAVENOUS

## 2021-08-22 MED ORDER — DEXMEDETOMIDINE (PRECEDEX) IN NS 20 MCG/5ML (4 MCG/ML) IV SYRINGE
PREFILLED_SYRINGE | INTRAVENOUS | Status: AC
  Filled 2021-08-22: qty 10

## 2021-08-22 MED ORDER — GLYCOPYRROLATE PF 0.2 MG/ML IJ SOSY
PREFILLED_SYRINGE | INTRAMUSCULAR | Status: AC
  Filled 2021-08-22: qty 1

## 2021-08-22 MED ORDER — LACTATED RINGERS IV SOLN: INTRAVENOUS | Status: DC

## 2021-08-22 MED ORDER — SUGAMMADEX SODIUM 200 MG/2ML IV SOLN
INTRAVENOUS | Status: DC | PRN
  Administered 2021-08-22: 140 mg via INTRAVENOUS

## 2021-08-22 MED ORDER — ONDANSETRON HCL 4 MG/2ML IJ SOLN
4.0000 mg | Freq: Once | INTRAMUSCULAR | Status: AC
  Administered 2021-08-22: 19:00:00 4 mg via INTRAVENOUS

## 2021-08-22 MED ORDER — ACETAMINOPHEN 325 MG PO TABS: 650.0000 mg | ORAL_TABLET | ORAL | Status: DC | PRN

## 2021-08-22 MED ORDER — LIDOCAINE 2% (20 MG/ML) 5 ML SYRINGE
INTRAMUSCULAR | Status: AC
  Filled 2021-08-22: qty 5

## 2021-08-22 MED ORDER — HYDROCODONE-ACETAMINOPHEN 5-325 MG PO TABS
ORAL_TABLET | ORAL | Status: AC
  Filled 2021-08-22: qty 2

## 2021-08-22 MED ORDER — HYDROCODONE-ACETAMINOPHEN 5-325 MG PO TABS
1.0000 | ORAL_TABLET | ORAL | Status: DC | PRN
  Administered 2021-08-22 – 2021-08-23 (×3): 2 via ORAL

## 2021-08-22 MED ORDER — DEXMEDETOMIDINE (PRECEDEX) IN NS 20 MCG/5ML (4 MCG/ML) IV SYRINGE
PREFILLED_SYRINGE | INTRAVENOUS | Status: DC | PRN
  Administered 2021-08-22 (×2): 10 ug via INTRAVENOUS

## 2021-08-22 MED ORDER — DEXAMETHASONE SODIUM PHOSPHATE 10 MG/ML IJ SOLN
INTRAMUSCULAR | Status: DC | PRN
Start: 2021-08-22 — End: 2021-08-22
  Administered 2021-08-22: 10 mg via INTRAVENOUS

## 2021-08-22 MED ORDER — PROPOFOL 10 MG/ML IV BOLUS
INTRAVENOUS | Status: DC | PRN
  Administered 2021-08-22: 50 mg via INTRAVENOUS
  Administered 2021-08-22: 150 mg via INTRAVENOUS

## 2021-08-22 MED ORDER — SCOPOLAMINE 1 MG/3DAYS TD PT72
1.0000 | MEDICATED_PATCH | Freq: Once | TRANSDERMAL | Status: DC
  Administered 2021-08-22: 12:00:00 1.5 mg via TRANSDERMAL

## 2021-08-22 MED ORDER — PROPOFOL 500 MG/50ML IV EMUL
INTRAVENOUS | Status: DC | PRN
  Administered 2021-08-22: 150 ug/kg/min via INTRAVENOUS

## 2021-08-22 MED ORDER — PROMETHAZINE HCL 25 MG/ML IJ SOLN: 6.2500 mg | INTRAMUSCULAR | Status: DC | PRN

## 2021-08-22 MED ORDER — CEFAZOLIN SODIUM-DEXTROSE 2-4 GM/100ML-% IV SOLN
INTRAVENOUS | Status: AC
  Filled 2021-08-22: qty 100

## 2021-08-22 MED ORDER — HYDROMORPHONE HCL 1 MG/ML IJ SOLN
0.5000 mg | INTRAMUSCULAR | Status: DC | PRN
  Administered 2021-08-22: 20:00:00 0.5 mg via INTRAVENOUS

## 2021-08-22 MED ORDER — POVIDONE-IODINE 10 % EX SWAB: 2.0000 "application " | Freq: Once | CUTANEOUS | Status: DC

## 2021-08-22 MED ORDER — ROCURONIUM BROMIDE 10 MG/ML (PF) SYRINGE
PREFILLED_SYRINGE | INTRAVENOUS | Status: AC
  Filled 2021-08-22: qty 10

## 2021-08-22 MED ORDER — ONDANSETRON HCL 4 MG/2ML IJ SOLN
INTRAMUSCULAR | Status: DC | PRN
  Administered 2021-08-22: 4 mg via INTRAVENOUS

## 2021-08-22 MED ORDER — SCOPOLAMINE 1 MG/3DAYS TD PT72
MEDICATED_PATCH | TRANSDERMAL | Status: AC
  Filled 2021-08-22: qty 1

## 2021-08-22 MED ORDER — GABAPENTIN 300 MG PO CAPS
300.0000 mg | ORAL_CAPSULE | Freq: Once | ORAL | Status: AC
  Administered 2021-08-22: 12:00:00 300 mg via ORAL

## 2021-08-22 MED ORDER — FENTANYL CITRATE (PF) 250 MCG/5ML IJ SOLN
INTRAMUSCULAR | Status: AC
  Filled 2021-08-22: qty 5

## 2021-08-22 MED ORDER — CEFAZOLIN SODIUM-DEXTROSE 2-4 GM/100ML-% IV SOLN
2.0000 g | INTRAVENOUS | Status: AC
  Administered 2021-08-22: 13:00:00 2 g via INTRAVENOUS

## 2021-08-22 MED ORDER — BUPIVACAINE HCL (PF) 0.25 % IJ SOLN
INTRAMUSCULAR | Status: DC | PRN
  Administered 2021-08-22: 10 mL

## 2021-08-22 MED ORDER — FENTANYL CITRATE (PF) 100 MCG/2ML IJ SOLN
INTRAMUSCULAR | Status: AC
  Filled 2021-08-22: qty 2

## 2021-08-22 MED ORDER — GABAPENTIN 300 MG PO CAPS
ORAL_CAPSULE | ORAL | Status: AC
  Filled 2021-08-22: qty 1

## 2021-08-22 MED ORDER — SODIUM CHLORIDE 0.9 % IR SOLN
Status: DC | PRN
  Administered 2021-08-22: 300 mL
  Administered 2021-08-22: 1000 mL

## 2021-08-22 MED ORDER — ACETAMINOPHEN 500 MG PO TABS
ORAL_TABLET | ORAL | Status: AC
  Filled 2021-08-22: qty 2

## 2021-08-22 MED ORDER — STERILE WATER FOR IRRIGATION IR SOLN
Status: DC | PRN
  Administered 2021-08-22: 500 mL

## 2021-08-22 MED ORDER — FENTANYL CITRATE (PF) 100 MCG/2ML IJ SOLN
INTRAMUSCULAR | Status: DC | PRN
  Administered 2021-08-22: 100 ug via INTRAVENOUS
  Administered 2021-08-22 (×2): 25 ug via INTRAVENOUS
  Administered 2021-08-22: 100 ug via INTRAVENOUS
  Administered 2021-08-22: 50 ug via INTRAVENOUS

## 2021-08-22 MED ORDER — OXYCODONE-ACETAMINOPHEN 5-325 MG PO TABS: 2.0000 | ORAL_TABLET | ORAL | Status: DC | PRN

## 2021-08-22 MED ORDER — ACETAMINOPHEN 500 MG PO TABS
1000.0000 mg | ORAL_TABLET | Freq: Once | ORAL | Status: AC
  Administered 2021-08-22: 12:00:00 1000 mg via ORAL

## 2021-08-22 MED ORDER — GLYCOPYRROLATE 0.2 MG/ML IJ SOLN
INTRAMUSCULAR | Status: DC | PRN
  Administered 2021-08-22: .2 mg via INTRAVENOUS

## 2021-08-22 SURGICAL SUPPLY — 60 items
CATH FOLEY 3WAY  5CC 16FR (CATHETERS) ×2
CATH FOLEY 3WAY 5CC 16FR (CATHETERS) ×1 IMPLANT
COVER BACK TABLE 60X90IN (DRAPES) ×3 IMPLANT
COVER TIP SHEARS 8 DVNC (MISCELLANEOUS) ×1 IMPLANT
COVER TIP SHEARS 8MM DA VINCI (MISCELLANEOUS) ×2
DECANTER SPIKE VIAL GLASS SM (MISCELLANEOUS) ×4 IMPLANT
DEFOGGER SCOPE WARMER CLEARIFY (MISCELLANEOUS) ×3 IMPLANT
DERMABOND ADVANCED (GAUZE/BANDAGES/DRESSINGS) ×2
DERMABOND ADVANCED .7 DNX12 (GAUZE/BANDAGES/DRESSINGS) ×1 IMPLANT
DRAPE ARM DVNC X/XI (DISPOSABLE) ×4 IMPLANT
DRAPE COLUMN DVNC XI (DISPOSABLE) ×1 IMPLANT
DRAPE DA VINCI XI ARM (DISPOSABLE) ×8
DRAPE DA VINCI XI COLUMN (DISPOSABLE) ×2
DRAPE UTILITY XL STRL (DRAPES) ×3 IMPLANT
DURAPREP 26ML APPLICATOR (WOUND CARE) ×3 IMPLANT
ELECT REM PT RETURN 9FT ADLT (ELECTROSURGICAL) ×3
ELECTRODE REM PT RTRN 9FT ADLT (ELECTROSURGICAL) ×1 IMPLANT
GAUZE 4X4 16PLY ~~LOC~~+RFID DBL (SPONGE) ×6 IMPLANT
GAUZE VASELINE 3X9 (GAUZE/BANDAGES/DRESSINGS) ×3 IMPLANT
GLOVE SURG ENC MOIS LTX SZ6.5 (GLOVE) ×9 IMPLANT
GLOVE SURG LTX SZ6.5 (GLOVE) ×6 IMPLANT
GLOVE SURG POLYISO LF SZ7 (GLOVE) ×6 IMPLANT
GLOVE SURG UNDER POLY LF SZ6.5 (GLOVE) ×4 IMPLANT
GLOVE SURG UNDER POLY LF SZ7 (GLOVE) ×21 IMPLANT
GOWN STRL REUS W/ TWL LRG LVL3 (GOWN DISPOSABLE) IMPLANT
GOWN STRL REUS W/TWL LRG LVL3 (GOWN DISPOSABLE) ×2
HOLDER FOLEY CATH W/STRAP (MISCELLANEOUS) ×2 IMPLANT
IRRIG SUCT STRYKERFLOW 2 WTIP (MISCELLANEOUS) ×3
IRRIGATION SUCT STRKRFLW 2 WTP (MISCELLANEOUS) ×1 IMPLANT
IV NS 1000ML (IV SOLUTION) ×4
IV NS 1000ML BAXH (IV SOLUTION) IMPLANT
KIT TURNOVER CYSTO (KITS) ×3 IMPLANT
LEGGING LITHOTOMY PAIR STRL (DRAPES) ×3 IMPLANT
MANIFOLD NEPTUNE II (INSTRUMENTS) ×2 IMPLANT
OBTURATOR OPTICAL STANDARD 8MM (TROCAR) ×2
OBTURATOR OPTICAL STND 8 DVNC (TROCAR) ×1
OBTURATOR OPTICALSTD 8 DVNC (TROCAR) ×1 IMPLANT
OCCLUDER COLPOPNEUMO (BALLOONS) ×3 IMPLANT
PACK ROBOT WH (CUSTOM PROCEDURE TRAY) ×3 IMPLANT
PACK ROBOTIC GOWN (GOWN DISPOSABLE) ×3 IMPLANT
PACK TRENDGUARD 450 HYBRID PRO (MISCELLANEOUS) IMPLANT
PAD OB MATERNITY 4.3X12.25 (PERSONAL CARE ITEMS) ×3 IMPLANT
PAD PREP 24X48 CUFFED NSTRL (MISCELLANEOUS) ×3 IMPLANT
POUCH ENDO CATCH II 15MM (MISCELLANEOUS) IMPLANT
PROTECTOR NERVE ULNAR (MISCELLANEOUS) ×6 IMPLANT
RTRCTR WOUND ALEXIS 18CM SML (INSTRUMENTS)
SAVER CELL AAL HAEMONETICS (INSTRUMENTS) IMPLANT
SEAL CANN UNIV 5-8 DVNC XI (MISCELLANEOUS) ×4 IMPLANT
SEAL XI 5MM-8MM UNIVERSAL (MISCELLANEOUS) ×8
SET IRRIG Y TYPE TUR BLADDER L (SET/KITS/TRAYS/PACK) ×2 IMPLANT
SET TRI-LUMEN FLTR TB AIRSEAL (TUBING) ×3 IMPLANT
SUT VIC AB 4-0 PS2 27 (SUTURE) ×9 IMPLANT
SUT VICRYL 0 UR6 27IN ABS (SUTURE) ×3 IMPLANT
SUT VLOC 180 0 9IN  GS21 (SUTURE) ×2
SUT VLOC 180 0 9IN GS21 (SUTURE) ×1 IMPLANT
TIP UTERINE 6.7X10CM GRN DISP (MISCELLANEOUS) ×2 IMPLANT
TOWEL OR 17X26 10 PK STRL BLUE (TOWEL DISPOSABLE) ×3 IMPLANT
TRENDGUARD 450 HYBRID PRO PACK (MISCELLANEOUS) ×3
TROCAR PORT AIRSEAL 5X120 (TROCAR) ×3 IMPLANT
WATER STERILE IRR 500ML POUR (IV SOLUTION) ×2 IMPLANT

## 2021-08-22 NOTE — H&P (Addendum)
Dawn Evans is an 45 y.o. female. G2P2L2   RP: XI Robotic TLH/BS on 08/22/2021   HPI: Pelvic US on 08/04/2021: Mirena IUD x 06/10/2020.  Heavy menses with severe cramps lasting 7-8 days.  Periods are every 25-35 days. LMP started on 08/19/21. No BTB.  Pelvic US 02/2021 showed fibroids.  No pain with intercourse.     Pertinent Gynecological History: Menses: flow is excessive with use of many pads or tampons on heaviest days Contraception: IUD Blood transfusions: none Sexually transmitted diseases: no past history Last mammogram: normal  Last pap: normal  OB History: G2, P2   Menstrual History: Patient's last menstrual period was 07/27/2021.    Past Medical History:  Diagnosis Date   Arthritis    oa right knee   Complication of anesthesia    Family history of adverse reaction to anesthesia    ponv for mother   Fibroid    History of COVID-19 2021   ALL SYMPTOMS RESOLVED   menustral migraine    PONV (postoperative nausea and vomiting)     Past Surgical History:  Procedure Laterality Date   INGUINAL HERNIA REPAIR     AGE 26   INTRAUTERINE DEVICE (IUD) INSERTION     mirena inserted 06-10-20   KNEE SURGERY Right 2016   meniscus repair   TONSILLECTOMY     as child age 5    Family History  Problem Relation Age of Onset   Diabetes Mother    Diabetes Maternal Uncle    Diabetes Maternal Grandmother    Hypertension Maternal Grandmother     Social History:  reports that she has never smoked. She has never used smokeless tobacco. She reports current alcohol use. She reports that she does not use drugs.  Allergies:  Allergies  Allergen Reactions   Avocado Other (See Comments)    Stomach pains   Almond Meal Rash   Peanuts [Peanut Oil] Rash    Medications Prior to Admission  Medication Sig Dispense Refill Last Dose   Cholecalciferol (VITAMIN D3 PO) Take 2,000 Units by mouth daily.   Past Month   fluticasone (FLONASE) 50 MCG/ACT nasal spray Place 2 sprays into both  nostrils daily. (Patient taking differently: Place 2 sprays into both nostrils as needed.) 48 g 3 Past Month   levonorgestrel (MIRENA) 20 MCG/24HR IUD 1 each by Intrauterine route once.   08/22/2021   cetirizine (ZYRTEC) 10 MG tablet Take 10 mg by mouth daily as needed for allergies.   More than a month   magnesium 30 MG tablet Take 30 mg by mouth daily.   More than a month   Turmeric 400 MG CAPS Take by mouth as needed.   More than a month    REVIEW OF SYSTEMS: A ROS was performed and pertinent positives and negatives are included in the history.  GENERAL: No fevers or chills. HEENT: No change in vision, no earache, sore throat or sinus congestion. NECK: No pain or stiffness. CARDIOVASCULAR: No chest pain or pressure. No palpitations. PULMONARY: No shortness of breath, cough or wheeze. GASTROINTESTINAL: No abdominal pain, nausea, vomiting or diarrhea, melena or bright red blood per rectum. GENITOURINARY: No urinary frequency, urgency, hesitancy or dysuria. MUSCULOSKELETAL: No joint or muscle pain, no back pain, no recent trauma. DERMATOLOGIC: No rash, no itching, no lesions. ENDOCRINE: No polyuria, polydipsia, no heat or cold intolerance. No recent change in weight. HEMATOLOGICAL: No anemia or easy bruising or bleeding. NEUROLOGIC: No headache, seizures, numbness, tingling or weakness. PSYCHIATRIC: No depression, no  loss of interest in normal activity or change in sleep pattern.     Blood pressure (!) 160/86, pulse (!) 49, temperature 97.9 F (36.6 C), temperature source Oral, resp. rate 17, height 5\' 6"  (1.676 m), weight 68.6 kg, last menstrual period 07/27/2021, SpO2 100 %.  Physical Exam:   Results for orders placed or performed during the hospital encounter of 08/22/21 (from the past 24 hour(s))  Pregnancy, urine POC     Status: None   Collection Time: 08/22/21 11:40 AM  Result Value Ref Range   Preg Test, Ur NEGATIVE NEGATIVE   Pelvic US on 08/04/2021: T/V images.  Comparison is made to  previous scan on February 23, 2021.  Anteverted enlarged uterus with multiple intramural and subserosal fibroids again noted today.  Largest fibroid is measured at 6.2 x 6.6 cm, previously measured at 5.8 x 5.6 cm.  The overall uterine size is measured at 10.9 x 7.97 x 8.55 cm.  Symmetrical endometrium with the IUD centrally located within the cavity with both arms extended.  No endometrial mass or thickening seen.  Left ovary within normal limits, previously seen cyst has completely resolved.  Right ovary with a 3.8 x 2.8 cm avascular cyst noted.  Small amount of free fluid is seen around the left adnexa.     Hb 07/11/2021 at 14.4     Assessment/Plan:  45 y.o. G2P2    1. Lower abdominal pain Mirena IUD x 06/10/2020.  Heavy menses with severe cramps lasting 7-8 days.  Periods are every 25-35 days.  No BTB.  Pelvic US 02/2021 showed fibroids.  No pain with intercourse.  Pelvic US from 08/04/2021 thoroughly reviewed with patient.  Mild increase in size of fibroids.  Mirena IUD in good IU position and normal endometrium with no mass or thickening. Left ovary normal.  Rt ovary with avascular simple cyst, functional cyst, at 3.8 cm.  Offered the Progestin pill to control ovulatory cysts by blocking ovulation.  Medical/hormonal treatments declined by patient, desires definitive surgical management.  Counseling done of different approaches.  Decision to proceed with XI Robotic TLH/BS.  Surgery and risks thoroughly reviewed as well as preop preparation and postop precautions and expectations.   2. Fibroids Mild increase in size.    3. Menorrhagia with regular cycle  Heavy menses on Mirena IUD.  No secondary anemia.  Hb 14.4 on 07/11/2021.  Decision to proceed with XI Robotic TLH/BS.                          Patient was counseled as to the risk of surgery to include the following:   1. Infection (prohylactic antibiotics will be administered)   2. DVT/Pulmonary Embolism (prophylactic pneumo compression stockings  will be used)   3.Trauma to internal organs requiring additional surgical procedure to repair any injury to internal organs requiring perhaps additional hospitalization days.   4.Hemmorhage requiring transfusion and blood products which carry risks such as  anaphylactic reaction, hepatitis and AIDS   Patient had received literature information on the procedure scheduled and all her questions were answered and fully accepts all risk.   Marie-Lyne Earland Reish 08/22/2021, 12:44 PM

## 2021-08-22 NOTE — Anesthesia Postprocedure Evaluation (Signed)
Anesthesia Post Note  Patient: Dawn Evans  Procedure(s) Performed: XI ROBOTIC ASSISTED TOTAL LAPAROSCOPIC HYSTERECTOMY AND SALPINGECTOMY (Bilateral: Abdomen)     Patient location during evaluation: PACU Anesthesia Type: General Level of consciousness: awake and alert Pain management: pain level controlled Vital Signs Assessment: post-procedure vital signs reviewed and stable Respiratory status: spontaneous breathing, nonlabored ventilation, respiratory function stable and patient connected to nasal cannula oxygen Cardiovascular status: blood pressure returned to baseline and stable Postop Assessment: no apparent nausea or vomiting Anesthetic complications: no   No notable events documented.  Last Vitals:  Vitals:   08/22/21 1800 08/22/21 1830  BP: (!) 146/65 119/62  Pulse: 72 (!) 56  Resp: 15 14  Temp: 36.7 C 37.5 C  SpO2: 100% 99%    Last Pain:  Vitals:   08/22/21 1800  TempSrc:   PainSc: Nolan

## 2021-08-22 NOTE — Anesthesia Preprocedure Evaluation (Addendum)
Anesthesia Evaluation  Patient identified by MRN, date of birth, ID band Patient awake    Reviewed: Allergy & Precautions, NPO status , Patient's Chart, lab work & pertinent test results  History of Anesthesia Complications (+) PONV, Family history of anesthesia reaction and history of anesthetic complications  Airway Mallampati: I  TM Distance: >3 FB Neck ROM: Full    Dental  (+) Teeth Intact, Dental Advisory Given   Pulmonary neg pulmonary ROS,    Pulmonary exam normal breath sounds clear to auscultation       Cardiovascular negative cardio ROS Normal cardiovascular exam Rhythm:Regular Rate:Normal     Neuro/Psych  Headaches,    GI/Hepatic negative GI ROS, Neg liver ROS,   Endo/Other  negative endocrine ROS  Renal/GU negative Renal ROS     Musculoskeletal  (+) Arthritis ,   Abdominal   Peds  Hematology negative hematology ROS (+)   Anesthesia Other Findings Day of surgery medications reviewed with the patient.  Reproductive/Obstetrics enlarged symptomatic uterine fibroids with pain and menorrhagia                            Anesthesia Physical Anesthesia Plan  ASA: 2  Anesthesia Plan: General   Post-op Pain Management:    Induction: Intravenous  PONV Risk Score and Plan: 4 or greater and Scopolamine patch - Pre-op, Midazolam, Dexamethasone, Ondansetron and TIVA  Airway Management Planned: Oral ETT  Additional Equipment:   Intra-op Plan:   Post-operative Plan: Extubation in OR  Informed Consent: I have reviewed the patients History and Physical, chart, labs and discussed the procedure including the risks, benefits and alternatives for the proposed anesthesia with the patient or authorized representative who has indicated his/her understanding and acceptance.     Dental advisory given  Plan Discussed with: CRNA  Anesthesia Plan Comments:        Anesthesia Quick  Evaluation

## 2021-08-22 NOTE — Anesthesia Procedure Notes (Signed)
Procedure Name: Intubation Date/Time: 08/22/2021 1:37 PM Performed by: Bonney Aid, CRNA Pre-anesthesia Checklist: Patient identified, Emergency Drugs available, Suction available and Patient being monitored Patient Re-evaluated:Patient Re-evaluated prior to induction Oxygen Delivery Method: Circle system utilized Preoxygenation: Pre-oxygenation with 100% oxygen Induction Type: IV induction Ventilation: Mask ventilation without difficulty Laryngoscope Size: Mac and 3 Grade View: Grade I Tube type: Oral Tube size: 7.0 mm Number of attempts: 1 Airway Equipment and Method: Stylet Placement Confirmation: ETT inserted through vocal cords under direct vision, positive ETCO2 and breath sounds checked- equal and bilateral Secured at: 20 cm Tube secured with: Tape Dental Injury: Teeth and Oropharynx as per pre-operative assessment

## 2021-08-22 NOTE — Op Note (Signed)
Operative Note  08/22/2021  4:19 PM  PATIENT:  Dawn Evans  45 y.o. female  PRE-OPERATIVE DIAGNOSIS:  Enlarged symptomatic uterine fibroids with pain and menorrhagia  POST-OPERATIVE DIAGNOSIS:  Enlarged symptomatic uterine fibroids with pain and menorrhagia  PROCEDURE:  Procedure(s): XI ROBOTIC ASSISTED TOTAL LAPAROSCOPIC HYSTERECTOMY AND BILATERAL SALPINGECTOMY  SURGEON:  Surgeon(s): [name], MD Assistant: Olivia Mackie  ANESTHESIA:   general  FINDINGS: Uterus enlarged with multiple Fibroids.  Bilateral tubes normal.  Left ovary normal.  Right ovary with an hemorrhagic cyst which was drained.  DESCRIPTION OF OPERATION: Under general anesthesia with endotracheal intubation, the patient is in lithotomy position.  She is prepped with DuraPrep on the abdomen and Betadine at the supra pubic, vulvar and vaginal areas.  She is draped as usual.  Timeout is done.  A #10 Rumi with a medium Koh ring are put in place of the uterus.  The Foley is inserted in the bladder.  The other instruments are removed.  We go to the abdomen.  The supra umbilical area is infiltrated with Marcaine one quarter plain.  A 1.5 cm incision is done with the scalpel.  The aponeurosis is opened with Mayo scissors under direct vision.  The parietal peritoneum is opened bluntly with a finger.  A pursestring stitch of Vicryl 0 is done on the aponeurosis.  The Sheryle Hail is inserted under direct vision and up pneumoperitoneum is created with CO2.  The camera is inserted at that level.  Inspection of the abdomen and pelvic cavities reveal the uterus and large in size with many fibroids.  The right ovary presents a small hemorrhagic cyst.  The left ovary is normal.  Bilateral tubes are normal.  No other pelvic lesion is present.  We infiltrated the skin with Marcaine one quarter plain at each port site.  Small incisions are made with a scalpel at each site.  All ports are inserted under direct vision with 2 robotic ports on the  right in line with the umbilicus and 1 robotic port on the left distal area in line with the umbilicus.  The 5 mm assistant port is inserted at the left medial side in line with the umbilicus.  The patient is positioned in 30 degree Trendelenburg position.  She tolerates it well.  The robot is docked.  All robotic instruments are inserted under direct vision with the fenestrated pro grasp in the fourth arm, the scissors in the third arm, the camera is in the second arm and the long bipolar in the first arm.  We go to the console.  Inspection of the pelvis confirms the previous findings.  Both ureters are seen with good peristalsis in normal anatomic position.  Pictures are taken.  We started on the right side with cauterization and section of the right mesosalpinx.  We then cauterized and section the right utero-ovarian ligament.  We cauterized and section the right round ligament.  We opened the visceral peritoneum anteriorly and start descending the bladder.  We proceeded exactly the same way on the left side.  Given that the bladder is ideal were not anteriorly and a low fibroid is present on the anterior right, we fill in the bladder to see its location more clearly.  We then further descend the bladder well past the [facility] ring.  We cauterized the right uterine artery close to the uterus.  We cauterized and section the left uterine artery.  We come back to the right and cauterized and section the right uterine artery.  The vaginal occluder is inflated.  We then open the superior aspect of the vagina on top of the [facility] ring with the tip of the scissors anteriorly, to each side and then posteriorly.  The uterus is completely detached with the cervix and both tubes.  It is successfully passed vaginally and sent to pathology.  The Mirena IUD is still in the uterus and therefore sent to pathology with the specimen.  We open the right hemorrhagic ovarian cyst to drain it.  We cauterized to complete hemostasis.  We used a  V-Loc 0 at 9 inches to close the vaginal vault.  We started at the right vaginal angle and do a running suture all the way to the left angle and then back to the midline.  We complete hemostasis with the bipolar at the left vaginal angle.  We irrigated and suctioned the pelvic cavity.  Pictures are taken of the pelvis.  The suture with the needle are removed from the abdominal pelvic cavities.  All robotic instruments are removed under direct vision.  The robot is undocked.  The patient is removed from Trendelenburg position.  Fluid is suctioned with the Nezhat.  Laparoscopic instruments are removed.  The CO2 is evacuated from the abdominal pelvic cavities.  The suprapubic incision is closed by attaching the pursestring suture at the aponeurosis.  All incisions are closed with a subcuticular suture of Vicryl 4-0.  Dermabond is added on all incisions.  The occluder is removed from the vagina.  Hemostasis is adequate at that level.  The patient is brought to recovery room in good and stable status.  ESTIMATED BLOOD LOSS: 50 mL   Intake/Output Summary (Last 24 hours) at 08/22/2021 1619 Last data filed at 08/22/2021 1517 Gross per 24 hour  Intake 100 ml  Output 350 ml  Net -250 ml     BLOOD ADMINISTERED:none   LOCAL MEDICATIONS USED:  MARCAINE     SPECIMEN:  Source of Specimen:  Uterus with cervix and bilateral tubes  DISPOSITION OF SPECIMEN:  PATHOLOGY  COUNTS:  YES  PLAN OF CARE: Transfer to PACU  Marie-Lyne LavoieMD4:19 PM

## 2021-08-22 NOTE — Transfer of Care (Signed)
Immediate Anesthesia Transfer of Care Note  Patient: Dawn Evans  Procedure(s) Performed: XI ROBOTIC ASSISTED TOTAL LAPAROSCOPIC HYSTERECTOMY AND SALPINGECTOMY (Bilateral: Abdomen)  Patient Location: PACU  Anesthesia Type:General  Level of Consciousness: confused  Airway & Oxygen Therapy: Patient Spontanous Breathing and Patient connected to nasal cannula oxygen  Post-op Assessment: Report given to RN  Post vital signs: Reviewed and stable  Last Vitals:  Vitals Value Taken Time  BP 114/54 08/22/21 1634  Temp    Pulse 66 08/22/21 1637  Resp 16 08/22/21 1635  SpO2 100 % 08/22/21 1637  Vitals shown include unvalidated device data.  Last Pain:  Vitals:   08/22/21 1204  TempSrc: Oral  PainSc: 3       Patients Stated Pain Goal: 5 (95/97/47 1855)  Complications: No notable events documented.

## 2021-08-23 ENCOUNTER — Encounter (HOSPITAL_BASED_OUTPATIENT_CLINIC_OR_DEPARTMENT_OTHER): Payer: Self-pay | Admitting: Obstetrics & Gynecology

## 2021-08-23 DIAGNOSIS — N920 Excessive and frequent menstruation with regular cycle: Secondary | ICD-10-CM | POA: Diagnosis not present

## 2021-08-23 DIAGNOSIS — N838 Other noninflammatory disorders of ovary, fallopian tube and broad ligament: Secondary | ICD-10-CM | POA: Diagnosis not present

## 2021-08-23 DIAGNOSIS — D259 Leiomyoma of uterus, unspecified: Secondary | ICD-10-CM | POA: Diagnosis not present

## 2021-08-23 DIAGNOSIS — Z9889 Other specified postprocedural states: Secondary | ICD-10-CM | POA: Diagnosis not present

## 2021-08-23 LAB — CBC
HCT: 39.5 % (ref 36.0–46.0)
Hemoglobin: 13.1 g/dL (ref 12.0–15.0)
MCH: 30.5 pg (ref 26.0–34.0)
MCHC: 33.2 g/dL (ref 30.0–36.0)
MCV: 92.1 fL (ref 80.0–100.0)
Platelets: 225 10*3/uL (ref 150–400)
RBC: 4.29 MIL/uL (ref 3.87–5.11)
RDW: 13 % (ref 11.5–15.5)
WBC: 13.1 10*3/uL — ABNORMAL HIGH (ref 4.0–10.5)
nRBC: 0 % (ref 0.0–0.2)

## 2021-08-23 MED ORDER — HYDROCODONE-ACETAMINOPHEN 5-325 MG PO TABS
ORAL_TABLET | ORAL | Status: AC
  Filled 2021-08-23: qty 2

## 2021-08-23 MED ORDER — HYDROCODONE-ACETAMINOPHEN 5-325 MG PO TABS: 1.0000 | ORAL_TABLET | Freq: Four times a day (QID) | ORAL | 0 refills | Status: DC | PRN

## 2021-08-23 NOTE — Addendum Note (Signed)
Addendum  created 08/23/21 6389 by Bonney Aid, CRNA   Intraprocedure Event edited, Intraprocedure Staff edited

## 2021-08-23 NOTE — Progress Notes (Signed)
POD#1 XI Robotic TLH/BS  Subjective: Patient reports tolerating PO and no problems voiding.    Objective: I have reviewed patient's vital signs.  vital signs, intake and output, and medications.  Vitals:   08/23/21 0530 08/23/21 0841  BP: 131/67 140/65  Pulse: (!) 47 (!) 43  Resp: 18 18  Temp: 98.3 F (36.8 C) 98.1 F (36.7 C)  SpO2: 98% 98%   I/O last 3 completed shifts: In: 7416 [P.O.:120; I.V.:1509; IV Piggyback:100] Out: 2500 [Urine:2450; Blood:50] No intake/output data recorded.  Results for orders placed or performed during the hospital encounter of 08/22/21 (from the past 24 hour(s))  Pregnancy, urine POC     Status: None   Collection Time: 08/22/21 11:40 AM  Result Value Ref Range   Preg Test, Ur NEGATIVE NEGATIVE  ABO/Rh     Status: None   Collection Time: 08/22/21 12:01 PM  Result Value Ref Range   ABO/RH(D)      O POS Performed at [facility], [place] [address] [place], [place] [address]   CBC     Status: Abnormal   Collection Time: 08/23/21  2:08 AM  Result Value Ref Range   WBC 13.1 (H) 4.0 - 10.5 K/uL   RBC 4.29 3.87 - 5.11 MIL/uL   Hemoglobin 13.1 12.0 - 15.0 g/dL   HCT 39.5 36.0 - 46.0 %   MCV 92.1 80.0 - 100.0 fL   MCH 30.5 26.0 - 34.0 pg   MCHC 33.2 30.0 - 36.0 g/dL   RDW 13.0 11.5 - 15.5 %   Platelets 225 150 - 400 K/uL   nRBC 0.0 0.0 - 0.2 %    EXAM General: alert, cooperative, and appears stated age Resp: clear to auscultation bilaterally Cardio: regular rate and rhythm GI: soft, non-tender; bowel sounds normal; no masses,  no organomegaly and incision: clean, dry, and intact Extremities: no edema, redness or tenderness in the calves or thighs Vaginal Bleeding: none  Assessment: s/p Procedure(s): XI ROBOTIC ASSISTED TOTAL LAPAROSCOPIC HYSTERECTOMY AND SALPINGECTOMY: stable, progressing well, and tolerating diet  Plan: Discharge home  LOS: 0 days    Dawn Bruins, MD 08/23/2021 9:01 AM

## 2021-08-23 NOTE — Addendum Note (Signed)
Addendum  created 08/23/21 0263 by Bonney Aid, CRNA   Charge Capture section accepted, Visit diagnoses modified

## 2021-08-25 LAB — SURGICAL PATHOLOGY

## 2021-09-07 ENCOUNTER — Other Ambulatory Visit: Payer: Self-pay

## 2021-09-07 ENCOUNTER — Ambulatory Visit (INDEPENDENT_AMBULATORY_CARE_PROVIDER_SITE_OTHER): Payer: BC Managed Care – PPO | Admitting: Obstetrics & Gynecology

## 2021-09-07 ENCOUNTER — Encounter: Payer: Self-pay | Admitting: Obstetrics & Gynecology

## 2021-09-07 VITALS — BP 114/74

## 2021-09-07 DIAGNOSIS — Z09 Encounter for follow-up examination after completed treatment for conditions other than malignant neoplasm: Secondary | ICD-10-CM

## 2021-09-07 NOTE — Progress Notes (Signed)
° ° °  Dawn Evans 28-Aug-1976 [id number]        46 y.o.  G2P2   RP: Postop XI Robotic TLH/BS on 08/22/2021  HPI: Very good postop healing.  Minimal vaginal brownish d/c.  No abdominopelvic pain.  No fever.  Urine/BMs normal.   OB History  Gravida Para Term Preterm AB Living  2 2       2   SAB IAB Ectopic Multiple Live Births               # Outcome Date GA Lbr Len/2nd Weight Sex Delivery Anes PTL Lv  2 Para           1 Para             Past medical history,surgical history, problem list, medications, allergies, family history and social history were all reviewed and documented in the EPIC chart.   Directed ROS with pertinent positives and negatives documented in the history of present illness/assessment and plan.  Exam:  Vitals:   09/07/21 0758  BP: 114/74   General appearance:  Normal  Abdomen: Incisions well closed.  No erythema.  No induration.  Resolving bruise at left distal incision.  Gynecologic exam: Vulva normal.  Vaginal vault closed.  Very light brown tint to discharge.   Assessment/Plan:  46 y.o. G2P2   1. Status post gynecological surgery, follow-up exam  Excellent postop healing.  No Cx.  Will dissolve the glue on incisions with Vaseline next week.  Slow progression to physical activities discussed.  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  F/U 4 weeks to recheck vaginal vault.  [name] MD, 8:15 AM 09/07/2021

## 2021-10-04 ENCOUNTER — Encounter: Payer: Self-pay | Admitting: Obstetrics & Gynecology

## 2021-10-04 ENCOUNTER — Other Ambulatory Visit: Payer: Self-pay

## 2021-10-04 ENCOUNTER — Ambulatory Visit (INDEPENDENT_AMBULATORY_CARE_PROVIDER_SITE_OTHER): Payer: BC Managed Care – PPO | Admitting: Obstetrics & Gynecology

## 2021-10-04 VITALS — BP 120/80

## 2021-10-04 DIAGNOSIS — Z09 Encounter for follow-up examination after completed treatment for conditions other than malignant neoplasm: Secondary | ICD-10-CM

## 2021-10-04 NOTE — Progress Notes (Signed)
° ° °  Dawn Evans Feb 10, 1976 [id number]        46 y.o.  G2P2L2    RP: Postop XI Robotic TLH/BS on 08/22/2021   HPI: Very good postop healing.  Minimal vaginal brownish d/c.  No abdominopelvic pain.  No fever.  Urine/BMs normal.    OB History  Gravida Para Term Preterm AB Living  2 2       2   SAB IAB Ectopic Multiple Live Births               # Outcome Date GA Lbr Len/2nd Weight Sex Delivery Anes PTL Lv  2 Para           1 Para             Past medical history,surgical history, problem list, medications, allergies, family history and social history were all reviewed and documented in the EPIC chart.   Directed ROS with pertinent positives and negatives documented in the history of present illness/assessment and plan.  Exam:  Vitals:   10/04/21 1009  BP: 120/80   General appearance:  Normal  Abdomen: Incisions well healed.  Small stitches trimmed at the skin.  Gynecologic exam: Vulva normal.  Bimanual exam:  Vaginal vault well closed and healed.  No Pelvic mass, NT.     Assessment/Plan:  46 y.o. G2P2   1. Status post gynecological surgery, follow-up exam  Very good postop healing.  Minimal vaginal brownish d/c.  No abdominopelvic pain.  No fever.  Urine/BMs normal.  Vaginal vault well healed.  Can resume all physical activities.  Sexual activities can be resumed in 2 weeks.  F/U Annual Gyn exam in 07/2022.   Dawn Bruins MD, 10:17 AM 10/04/2021

## 2021-10-25 ENCOUNTER — Other Ambulatory Visit: Payer: Self-pay | Admitting: Family Medicine

## 2021-10-25 DIAGNOSIS — Z1231 Encounter for screening mammogram for malignant neoplasm of breast: Secondary | ICD-10-CM

## 2021-11-18 DIAGNOSIS — Z1231 Encounter for screening mammogram for malignant neoplasm of breast: Secondary | ICD-10-CM

## 2021-11-22 ENCOUNTER — Other Ambulatory Visit: Payer: Self-pay | Admitting: Family Medicine

## 2021-11-22 DIAGNOSIS — Z1231 Encounter for screening mammogram for malignant neoplasm of breast: Secondary | ICD-10-CM

## 2021-11-25 ENCOUNTER — Ambulatory Visit
Admission: RE | Admit: 2021-11-25 | Discharge: 2021-11-25 | Disposition: A | Discharge status: Home / Self Care | Payer: BC Managed Care – PPO | Source: Ambulatory Visit

## 2021-11-25 DIAGNOSIS — Z1231 Encounter for screening mammogram for malignant neoplasm of breast: Secondary | ICD-10-CM

## 2022-02-16 DIAGNOSIS — M9901 Segmental and somatic dysfunction of cervical region: Secondary | ICD-10-CM | POA: Diagnosis not present

## 2022-02-16 DIAGNOSIS — M9902 Segmental and somatic dysfunction of thoracic region: Secondary | ICD-10-CM | POA: Diagnosis not present

## 2022-02-16 DIAGNOSIS — M7542 Impingement syndrome of left shoulder: Secondary | ICD-10-CM | POA: Diagnosis not present

## 2022-02-16 DIAGNOSIS — M9907 Segmental and somatic dysfunction of upper extremity: Secondary | ICD-10-CM | POA: Diagnosis not present

## 2022-02-23 DIAGNOSIS — M9907 Segmental and somatic dysfunction of upper extremity: Secondary | ICD-10-CM | POA: Diagnosis not present

## 2022-02-23 DIAGNOSIS — M7542 Impingement syndrome of left shoulder: Secondary | ICD-10-CM | POA: Diagnosis not present

## 2022-02-23 DIAGNOSIS — M9901 Segmental and somatic dysfunction of cervical region: Secondary | ICD-10-CM | POA: Diagnosis not present

## 2022-02-23 DIAGNOSIS — M9902 Segmental and somatic dysfunction of thoracic region: Secondary | ICD-10-CM | POA: Diagnosis not present

## 2022-02-28 DIAGNOSIS — M9907 Segmental and somatic dysfunction of upper extremity: Secondary | ICD-10-CM | POA: Diagnosis not present

## 2022-02-28 DIAGNOSIS — M9902 Segmental and somatic dysfunction of thoracic region: Secondary | ICD-10-CM | POA: Diagnosis not present

## 2022-02-28 DIAGNOSIS — M9901 Segmental and somatic dysfunction of cervical region: Secondary | ICD-10-CM | POA: Diagnosis not present

## 2022-02-28 DIAGNOSIS — M7542 Impingement syndrome of left shoulder: Secondary | ICD-10-CM | POA: Diagnosis not present

## 2022-03-02 DIAGNOSIS — M9907 Segmental and somatic dysfunction of upper extremity: Secondary | ICD-10-CM | POA: Diagnosis not present

## 2022-03-02 DIAGNOSIS — M7542 Impingement syndrome of left shoulder: Secondary | ICD-10-CM | POA: Diagnosis not present

## 2022-03-02 DIAGNOSIS — M9902 Segmental and somatic dysfunction of thoracic region: Secondary | ICD-10-CM | POA: Diagnosis not present

## 2022-03-02 DIAGNOSIS — M9901 Segmental and somatic dysfunction of cervical region: Secondary | ICD-10-CM | POA: Diagnosis not present

## 2022-03-06 DIAGNOSIS — M9907 Segmental and somatic dysfunction of upper extremity: Secondary | ICD-10-CM | POA: Diagnosis not present

## 2022-03-06 DIAGNOSIS — M9901 Segmental and somatic dysfunction of cervical region: Secondary | ICD-10-CM | POA: Diagnosis not present

## 2022-03-06 DIAGNOSIS — M9902 Segmental and somatic dysfunction of thoracic region: Secondary | ICD-10-CM | POA: Diagnosis not present

## 2022-03-06 DIAGNOSIS — M7542 Impingement syndrome of left shoulder: Secondary | ICD-10-CM | POA: Diagnosis not present

## 2022-03-09 DIAGNOSIS — M9902 Segmental and somatic dysfunction of thoracic region: Secondary | ICD-10-CM | POA: Diagnosis not present

## 2022-03-09 DIAGNOSIS — M7542 Impingement syndrome of left shoulder: Secondary | ICD-10-CM | POA: Diagnosis not present

## 2022-03-09 DIAGNOSIS — M9901 Segmental and somatic dysfunction of cervical region: Secondary | ICD-10-CM | POA: Diagnosis not present

## 2022-03-09 DIAGNOSIS — M9907 Segmental and somatic dysfunction of upper extremity: Secondary | ICD-10-CM | POA: Diagnosis not present

## 2022-03-30 DIAGNOSIS — M9901 Segmental and somatic dysfunction of cervical region: Secondary | ICD-10-CM | POA: Diagnosis not present

## 2022-03-30 DIAGNOSIS — M9902 Segmental and somatic dysfunction of thoracic region: Secondary | ICD-10-CM | POA: Diagnosis not present

## 2022-03-30 DIAGNOSIS — M7542 Impingement syndrome of left shoulder: Secondary | ICD-10-CM | POA: Diagnosis not present

## 2022-03-30 DIAGNOSIS — M9907 Segmental and somatic dysfunction of upper extremity: Secondary | ICD-10-CM | POA: Diagnosis not present

## 2022-04-06 DIAGNOSIS — M9907 Segmental and somatic dysfunction of upper extremity: Secondary | ICD-10-CM | POA: Diagnosis not present

## 2022-04-06 DIAGNOSIS — M9901 Segmental and somatic dysfunction of cervical region: Secondary | ICD-10-CM | POA: Diagnosis not present

## 2022-04-06 DIAGNOSIS — M9902 Segmental and somatic dysfunction of thoracic region: Secondary | ICD-10-CM | POA: Diagnosis not present

## 2022-04-06 DIAGNOSIS — M7542 Impingement syndrome of left shoulder: Secondary | ICD-10-CM | POA: Diagnosis not present

## 2022-05-05 DIAGNOSIS — M7542 Impingement syndrome of left shoulder: Secondary | ICD-10-CM | POA: Diagnosis not present

## 2022-05-05 DIAGNOSIS — M9907 Segmental and somatic dysfunction of upper extremity: Secondary | ICD-10-CM | POA: Diagnosis not present

## 2022-05-05 DIAGNOSIS — M9901 Segmental and somatic dysfunction of cervical region: Secondary | ICD-10-CM | POA: Diagnosis not present

## 2022-05-05 DIAGNOSIS — M9902 Segmental and somatic dysfunction of thoracic region: Secondary | ICD-10-CM | POA: Diagnosis not present

## 2022-05-09 DIAGNOSIS — M9901 Segmental and somatic dysfunction of cervical region: Secondary | ICD-10-CM | POA: Diagnosis not present

## 2022-05-09 DIAGNOSIS — M7542 Impingement syndrome of left shoulder: Secondary | ICD-10-CM | POA: Diagnosis not present

## 2022-05-09 DIAGNOSIS — M9907 Segmental and somatic dysfunction of upper extremity: Secondary | ICD-10-CM | POA: Diagnosis not present

## 2022-05-09 DIAGNOSIS — M9902 Segmental and somatic dysfunction of thoracic region: Secondary | ICD-10-CM | POA: Diagnosis not present

## 2022-05-12 DIAGNOSIS — M7542 Impingement syndrome of left shoulder: Secondary | ICD-10-CM | POA: Diagnosis not present

## 2022-05-12 DIAGNOSIS — M9902 Segmental and somatic dysfunction of thoracic region: Secondary | ICD-10-CM | POA: Diagnosis not present

## 2022-05-12 DIAGNOSIS — M9901 Segmental and somatic dysfunction of cervical region: Secondary | ICD-10-CM | POA: Diagnosis not present

## 2022-05-12 DIAGNOSIS — M9907 Segmental and somatic dysfunction of upper extremity: Secondary | ICD-10-CM | POA: Diagnosis not present

## 2022-06-20 ENCOUNTER — Encounter: Payer: Self-pay | Admitting: Family Medicine

## 2022-06-20 DIAGNOSIS — R930 Abnormal findings on diagnostic imaging of skull and head, not elsewhere classified: Secondary | ICD-10-CM

## 2022-07-12 ENCOUNTER — Ambulatory Visit (INDEPENDENT_AMBULATORY_CARE_PROVIDER_SITE_OTHER): Payer: BC Managed Care – PPO | Admitting: Family Medicine

## 2022-07-12 ENCOUNTER — Encounter: Payer: Self-pay | Admitting: Family Medicine

## 2022-07-12 VITALS — BP 120/80 | HR 62 | Temp 97.8°F | Resp 16 | Ht 66.0 in | Wt 152.2 lb

## 2022-07-12 DIAGNOSIS — Z1211 Encounter for screening for malignant neoplasm of colon: Secondary | ICD-10-CM | POA: Diagnosis not present

## 2022-07-12 DIAGNOSIS — E559 Vitamin D deficiency, unspecified: Secondary | ICD-10-CM

## 2022-07-12 DIAGNOSIS — Z0000 Encounter for general adult medical examination without abnormal findings: Secondary | ICD-10-CM | POA: Diagnosis not present

## 2022-07-12 LAB — BASIC METABOLIC PANEL
BUN: 9 mg/dL (ref 6–23)
CO2: 26 mEq/L (ref 19–32)
Calcium: 9.5 mg/dL (ref 8.4–10.5)
Chloride: 105 mEq/L (ref 96–112)
Creatinine, Ser: 0.7 mg/dL (ref 0.40–1.20)
GFR: 104.19 mL/min (ref >60.00)
Glucose, Bld: 93 mg/dL (ref 70–99)
Potassium: 4.5 mEq/L (ref 3.5–5.1)
Sodium: 137 mEq/L (ref 135–145)

## 2022-07-12 LAB — VITAMIN D 25 HYDROXY (VIT D DEFICIENCY, FRACTURES): VITD: 42.05 ng/mL (ref 30.00–100.00)

## 2022-07-12 LAB — CBC WITH DIFFERENTIAL/PLATELET
Basophils Absolute: 0 10*3/uL (ref 0.0–0.1)
Basophils Relative: 0.6 % (ref 0.0–3.0)
Eosinophils Absolute: 0.2 10*3/uL (ref 0.0–0.7)
Eosinophils Relative: 3.8 % (ref 0.0–5.0)
HCT: 41.5 % (ref 36.0–46.0)
Hemoglobin: 13.9 g/dL (ref 12.0–15.0)
Lymphocytes Relative: 29.2 % (ref 12.0–46.0)
Lymphs Abs: 1.8 10*3/uL (ref 0.7–4.0)
MCHC: 33.6 g/dL (ref 30.0–36.0)
MCV: 89.8 fl (ref 78.0–100.0)
Monocytes Absolute: 0.4 10*3/uL (ref 0.1–1.0)
Monocytes Relative: 6.2 % (ref 3.0–12.0)
Neutro Abs: 3.8 10*3/uL (ref 1.4–7.7)
Neutrophils Relative %: 60.2 % (ref 43.0–77.0)
Platelets: 281 10*3/uL (ref 150.0–400.0)
RBC: 4.62 Mil/uL (ref 3.87–5.11)
RDW: 13.5 % (ref 11.5–15.5)
WBC: 6.3 10*3/uL (ref 4.0–10.5)

## 2022-07-12 LAB — LIPID PANEL
Cholesterol: 164 mg/dL (ref 0–200)
HDL: 60.4 mg/dL (ref >39.00)
LDL Cholesterol: 86 mg/dL (ref 0–99)
NonHDL: 103.74
Total CHOL/HDL Ratio: 3
Triglycerides: 91 mg/dL (ref 0.0–149.0)
VLDL: 18.2 mg/dL (ref 0.0–40.0)

## 2022-07-12 LAB — HEPATIC FUNCTION PANEL
ALT: 10 U/L (ref 0–35)
AST: 16 U/L (ref 0–37)
Albumin: 4.5 g/dL (ref 3.5–5.2)
Alkaline Phosphatase: 33 U/L — ABNORMAL LOW (ref 39–117)
Bilirubin, Direct: 0.1 mg/dL (ref 0.0–0.3)
Total Bilirubin: 0.4 mg/dL (ref 0.2–1.2)
Total Protein: 7.3 g/dL (ref 6.0–8.3)

## 2022-07-12 LAB — TSH: TSH: 1.25 u[IU]/mL (ref 0.35–5.50)

## 2022-07-12 MED ORDER — SCOPOLAMINE 1 MG/3DAYS TD PT72: 1.0000 | MEDICATED_PATCH | TRANSDERMAL | 12 refills | Status: DC

## 2022-07-12 NOTE — Progress Notes (Signed)
   Subjective:    Patient ID: Dawn Evans, female    DOB: 1976/03/15, 46 y.o.   MRN: [id number]  HPI CPE- UTD on mammo, pap, Tdap.  Due to start colon cancer screening  Patient Care Team    Relationship Specialty Notifications Start End  Midge Minium, MD PCP - General Family Medicine  10/03/12   [name], MD Consulting Physician Obstetrics and Gynecology  04/16/15     Health Maintenance  Topic Date Due   COLONOSCOPY (Pts 45-42yr Insurance coverage will need to be confirmed)  Never done   INFLUENZA VACCINE  12/03/2022 (Originally 04/04/2022)   MAMMOGRAM  11/26/2022   PAP SMEAR-Modifier  07/12/2024   TETANUS/TDAP  04/18/2026   HPV VACCINES  Aged Out   COVID-19 Vaccine  Discontinued   Hepatitis C Screening  Discontinued   HIV Screening  Discontinued      Review of Systems Patient reports no vision/ hearing changes, adenopathy,fever, weight change,  persistant/recurrent hoarseness , swallowing issues, chest pain, palpitations, edema, persistant/recurrent cough, hemoptysis, dyspnea (rest/exertional/paroxysmal nocturnal), gastrointestinal bleeding (melena, rectal bleeding), abdominal pain, significant heartburn, bowel changes, GU symptoms (dysuria, hematuria, incontinence), Gyn symptoms (abnormal  bleeding, pain),  syncope, focal weakness, memory loss, numbness & tingling, skin/hair/nail changes, abnormal bruising or bleeding, anxiety, or depression.     Objective:   Physical Exam General Appearance:    Alert, cooperative, no distress, appears stated age  Head:    Normocephalic, without obvious abnormality, atraumatic  Eyes:    PERRL, conjunctiva/corneas clear, EOM's intact both eyes  Ears:    Normal TM's and external ear canals, both ears  Nose:   Nares normal, septum midline, mucosa normal, no drainage    or sinus tenderness  Throat:   Lips, mucosa, and tongue normal; teeth and gums normal  Neck:   Supple, symmetrical, trachea midline, no adenopathy;    Thyroid: no  enlargement/tenderness/nodules  Back:     Symmetric, no curvature, ROM normal, no CVA tenderness  Lungs:     Clear to auscultation bilaterally, respirations unlabored  Chest Wall:    No tenderness or deformity   Heart:    Regular rate and rhythm, S1 and S2 normal, no murmur, rub   or gallop  Breast Exam:    Deferred to GYN  Abdomen:     Soft, non-tender, bowel sounds active all four quadrants,    no masses, no organomegaly  Genitalia:    Deferred to GYN  Rectal:    Extremities:   Extremities normal, atraumatic, no cyanosis or edema  Pulses:   2+ and symmetric all extremities  Skin:   Skin color, texture, turgor normal, no rashes or lesions  Lymph nodes:   Cervical, supraclavicular, and axillary nodes normal  Neurologic:   CNII-XII intact, normal strength, sensation and reflexes    throughout          Assessment & Plan:

## 2022-07-12 NOTE — Assessment & Plan Note (Signed)
Pt's PE WNL.  UTD on pap, mammo, Tdap.  Pt elects to do Cologuard.  Order entered.  Check labs.  Anticipatory guidance provided.

## 2022-07-12 NOTE — Assessment & Plan Note (Signed)
Check labs and replete prn. 

## 2022-07-12 NOTE — Patient Instructions (Addendum)
Follow up in 1 year or as needed We'll notify you of your lab results and make any changes if needed Keep up the good work!  You look great! Complete and return the cologuard as directed Call with any questions or concerns Stay Safe!  Stay Healthy! GOOD LUCK WITH THE MOVE!!!

## 2022-07-13 ENCOUNTER — Telehealth: Payer: Self-pay

## 2022-07-13 DIAGNOSIS — R930 Abnormal findings on diagnostic imaging of skull and head, not elsewhere classified: Secondary | ICD-10-CM | POA: Diagnosis not present

## 2022-07-13 DIAGNOSIS — G44219 Episodic tension-type headache, not intractable: Secondary | ICD-10-CM | POA: Diagnosis not present

## 2022-07-13 NOTE — Telephone Encounter (Signed)
-----   Message from Midge Minium, MD sent at 07/13/2022  7:30 AM EST ----- Labs look great!  No changes at this time

## 2022-07-13 NOTE — Telephone Encounter (Signed)
Left pt a VM stating lab results  

## 2022-07-14 ENCOUNTER — Ambulatory Visit (INDEPENDENT_AMBULATORY_CARE_PROVIDER_SITE_OTHER): Payer: BC Managed Care – PPO | Admitting: Obstetrics & Gynecology

## 2022-07-14 ENCOUNTER — Encounter: Payer: Self-pay | Admitting: Obstetrics & Gynecology

## 2022-07-14 VITALS — BP 120/80 | HR 78 | Ht 66.0 in | Wt 151.0 lb

## 2022-07-14 DIAGNOSIS — Z01419 Encounter for gynecological examination (general) (routine) without abnormal findings: Secondary | ICD-10-CM

## 2022-07-14 DIAGNOSIS — Z90710 Acquired absence of both cervix and uterus: Secondary | ICD-10-CM | POA: Diagnosis not present

## 2022-07-14 NOTE — Progress Notes (Signed)
Malaysha Sanderlin 1976-01-20 [id number]   History:    46 y.o. G2P2L2 Married.  Son 10th grade, daughter 9th grade.  Both children will play soccer in [place].  Moving back to [place] in 08/2022.   RP:  Established patient presenting for annual gyn exam    HPI:  S/P XI Robotic TLH/BS on 08/22/2021.  Patho benign. Pap 07/2021 Neg.  No indication for a Pap at this time.  No pain with intercourse. Premenstrual bilateral breast tenderness unchanged. Screening mammo 11/2021 Neg.  Urine and bowel movements normal.  Body mass index 24.37.  Good fitness, patient is a Physiological scientist, and healthy nutrition. Health labs with family physician.  Flu vaccine declined.   Past medical history,surgical history, family history and social history were all reviewed and documented in the EPIC chart.  Gynecologic History Patient's last menstrual period was 07/27/2021.  Obstetric History OB History  Gravida Para Term Preterm AB Living  '2 2 2     2  '$ SAB IAB Ectopic Multiple Live Births               # Outcome Date GA Lbr Len/2nd Weight Sex Delivery Anes PTL Lv  2 Term           1 Term              ROS: A ROS was performed and pertinent positives and negatives are included in the history. GENERAL: No fevers or chills. HEENT: No change in vision, no earache, sore throat or sinus congestion. NECK: No pain or stiffness. CARDIOVASCULAR: No chest pain or pressure. No palpitations. PULMONARY: No shortness of breath, cough or wheeze. GASTROINTESTINAL: No abdominal pain, nausea, vomiting or diarrhea, melena or bright red blood per rectum. GENITOURINARY: No urinary frequency, urgency, hesitancy or dysuria. MUSCULOSKELETAL: No joint or muscle pain, no back pain, no recent trauma. DERMATOLOGIC: No rash, no itching, no lesions. ENDOCRINE: No polyuria, polydipsia, no heat or cold intolerance. No recent change in weight. HEMATOLOGICAL: No anemia or easy bruising or bleeding. NEUROLOGIC: No headache, seizures, numbness,  tingling or weakness. PSYCHIATRIC: No depression, no loss of interest in normal activity or change in sleep pattern.     Exam:   BP 120/80   Pulse 78   Ht '5\' 6"'$  (1.676 m)   Wt 151 lb (68.5 kg)   LMP 07/27/2021   SpO2 99%   BMI 24.37 kg/m   Body mass index is 24.37 kg/m.  General appearance : Well developed well nourished female. No acute distress HEENT: Eyes: no retinal hemorrhage or exudates,  Neck supple, trachea midline, no carotid bruits, no thyroidmegaly Lungs: Clear to auscultation, no rhonchi or wheezes, or rib retractions  Heart: Regular rate and rhythm, no murmurs or gallops Breast:Examined in sitting and supine position were symmetrical in appearance, no palpable masses or tenderness,  no skin retraction, no nipple inversion, no nipple discharge, no skin discoloration, no axillary or supraclavicular lymphadenopathy Abdomen: no palpable masses or tenderness, no rebound or guarding Extremities: no edema or skin discoloration or tenderness  Pelvic: Vulva: Normal             Vagina: No gross lesions or discharge  Cervix/Uterus absent  Adnexa  Without masses or tenderness  Anus: Normal   Assessment/Plan:  46 y.o. female for annual exam   1. Well female exam with routine gynecological exam S/P XI Robotic TLH/BS on 08/22/2021.  Patho benign. Pap 07/2021 Neg.  No indication for a Pap at this time.  No pain  with intercourse. Premenstrual bilateral breast tenderness unchanged. Screening mammo 11/2021 Neg.  Urine and bowel movements normal.  Body mass index 24.37.  Good fitness, patient is a Physiological scientist, and healthy nutrition. Health labs with family physician.  Flu vaccine declined.  2. S/P total hysterectomy/BS  Other orders - rizatriptan (MAXALT) 10 MG tablet; Take 1 tablet by mouth as needed. (Patient not taking: Reported on 07/14/2022)   [name] MD, 10:02 AM 07/14/2022

## 2022-07-22 IMAGING — MG MM DIGITAL SCREENING BILAT W/ TOMO AND CAD
8 series · 9 of 24 positions shown · non-contrast
Comparison: Previous exam(s).

CLINICAL DATA: Screening.

EXAM:
DIGITAL SCREENING BILATERAL MAMMOGRAM WITH TOMOSYNTHESIS AND CAD
TECHNIQUE: Bilateral screening digital craniocaudal and mediolateral oblique
mammograms were obtained. Bilateral screening digital breast
tomosynthesis was performed. The images were evaluated with
computer-aided detection.

[R CC synth-2D]
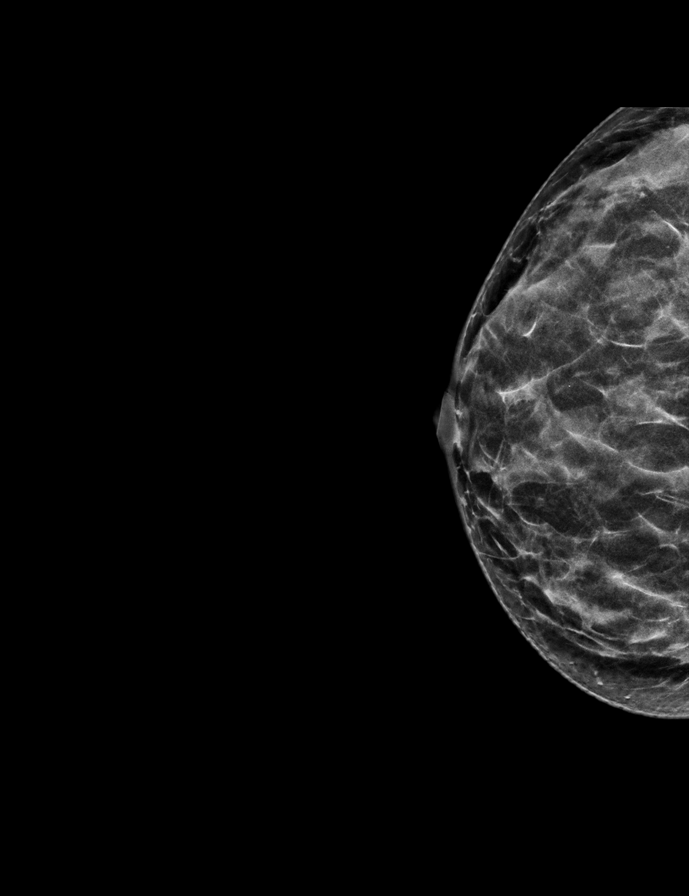

[L MLO synth-2D]
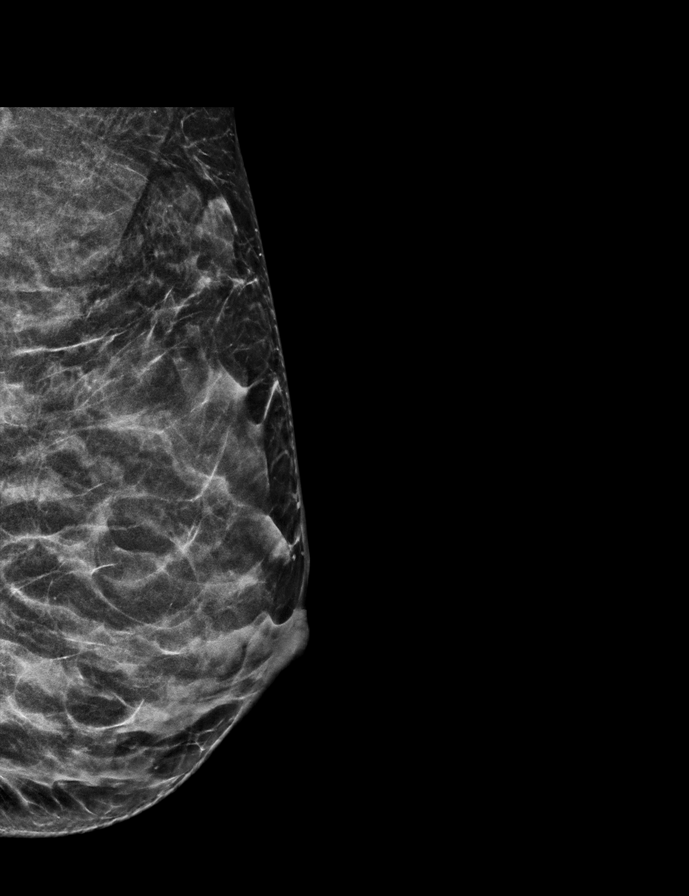

[R MLO synth-2D]
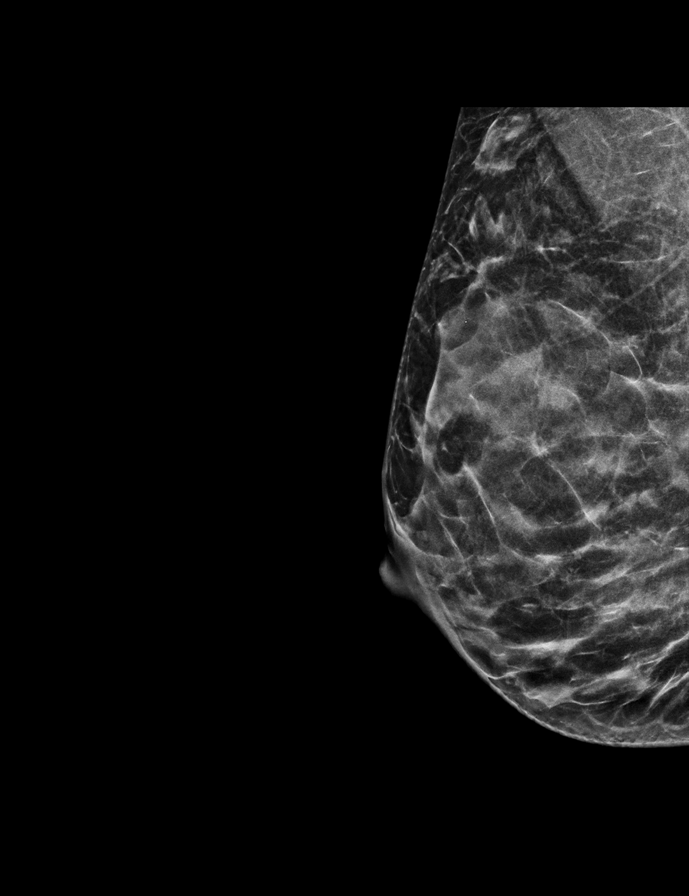

[L CC synth-2D]
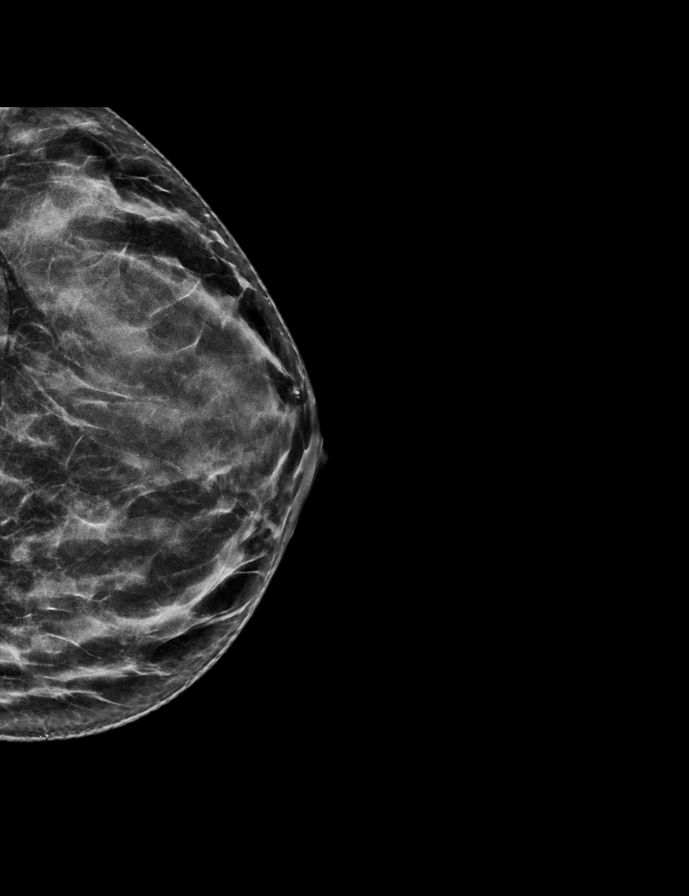

[R CC tomo · 2 of 45 frames shown]
[frame 15/45]
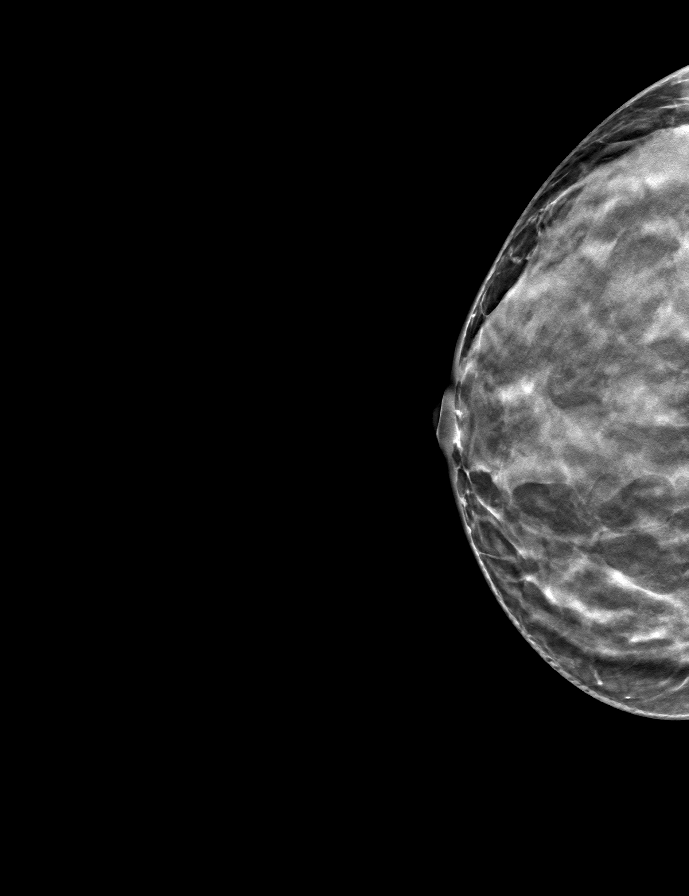
[frame 23/45]
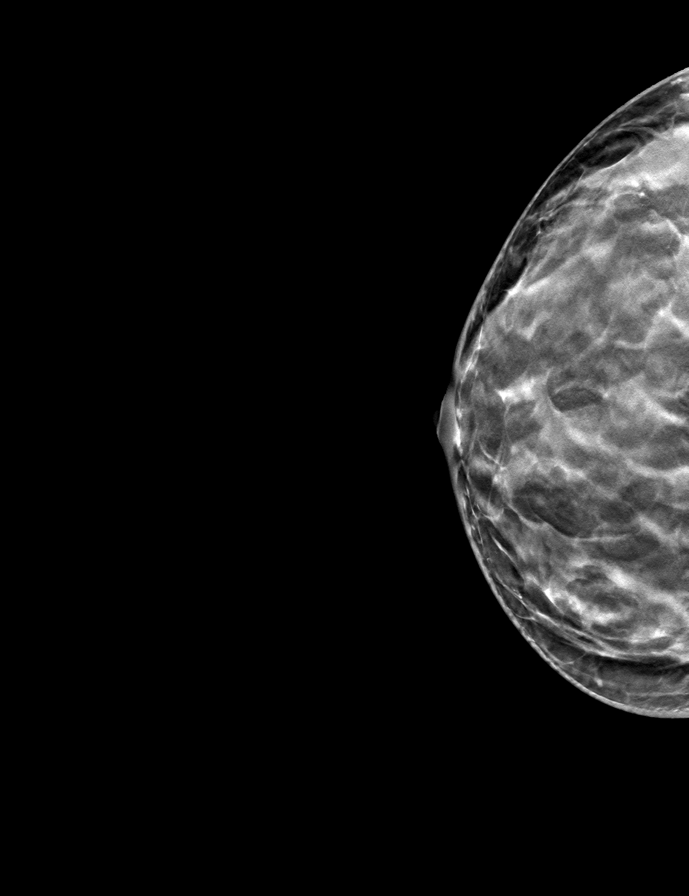

[R MLO tomo · tomo slice 23/46.0]
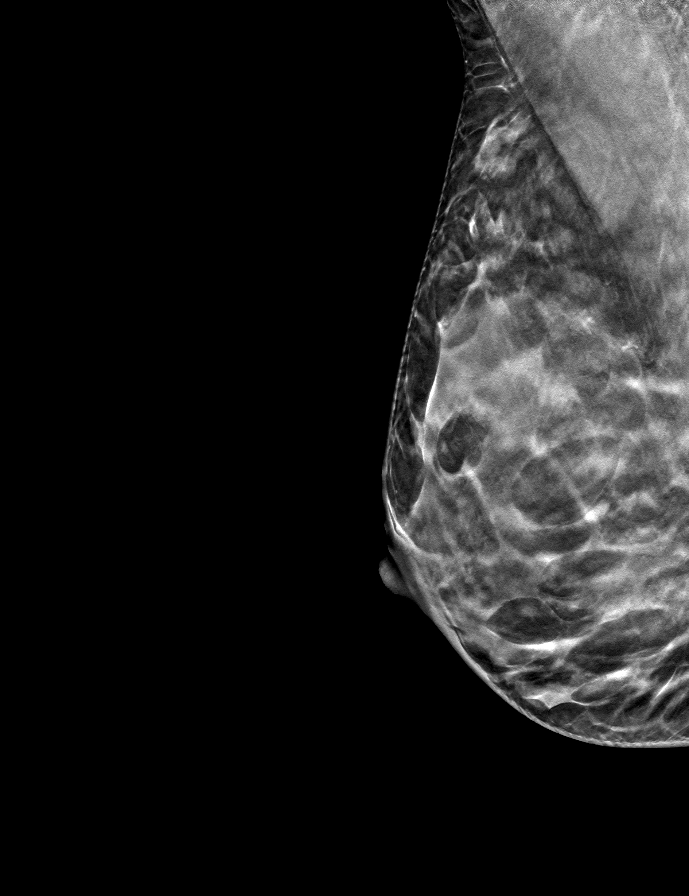

[L MLO tomo · tomo slice 25/50.0]
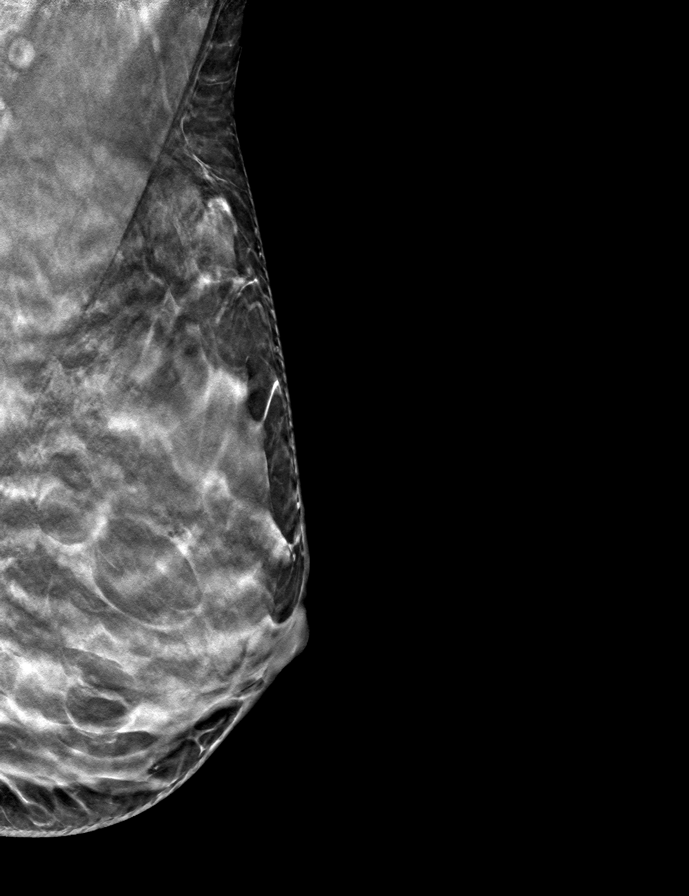

[L CC tomo · tomo slice 27/52.0]
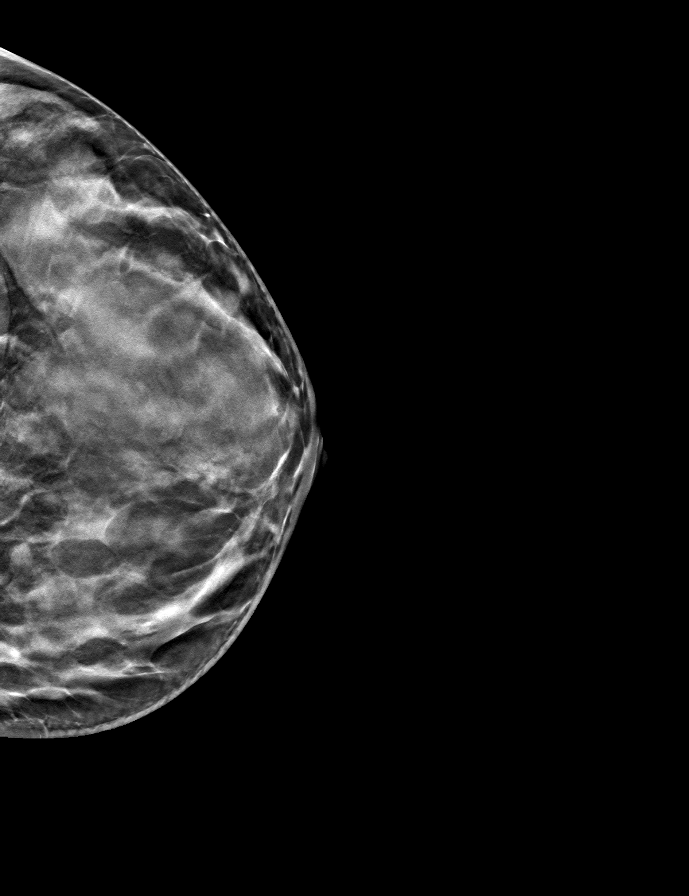

[9 of 24 positions shown; findings below may reference images not displayed]

ACR Breast Density Category d: The breast tissue is extremely dense,
which lowers the sensitivity of mammography
FINDINGS: There are no findings suspicious for malignancy.
IMPRESSION: No mammographic evidence of malignancy. A result letter of this
screening mammogram will be mailed directly to the patient.

RECOMMENDATION:
Screening mammogram in one year. (Code:TA-V-WV9)

BI-RADS CATEGORY  1: Negative.

## 2022-07-31 DIAGNOSIS — J3489 Other specified disorders of nose and nasal sinuses: Secondary | ICD-10-CM | POA: Diagnosis not present

## 2022-07-31 DIAGNOSIS — G44219 Episodic tension-type headache, not intractable: Secondary | ICD-10-CM | POA: Diagnosis not present

## 2022-08-01 DIAGNOSIS — Z1211 Encounter for screening for malignant neoplasm of colon: Secondary | ICD-10-CM | POA: Diagnosis not present

## 2022-08-09 LAB — COLOGUARD: COLOGUARD: NEGATIVE

## 2022-08-10 ENCOUNTER — Telehealth: Payer: Self-pay

## 2022-08-10 NOTE — Telephone Encounter (Signed)
-----   Message from Midge Minium, MD sent at 08/10/2022  7:44 AM EST ----- Normal cologuard.  Great news!

## 2022-08-10 NOTE — Telephone Encounter (Signed)
Left pt a vm with lab results

## 2022-08-14 DIAGNOSIS — L608 Other nail disorders: Secondary | ICD-10-CM | POA: Diagnosis not present

## 2022-08-14 DIAGNOSIS — L609 Nail disorder, unspecified: Secondary | ICD-10-CM | POA: Diagnosis not present

## 2022-10-24 ENCOUNTER — Encounter: Payer: Self-pay | Admitting: Family Medicine

## 2022-10-24 MED ORDER — SCOPOLAMINE 1 MG/3DAYS TD PT72: 1.0000 | MEDICATED_PATCH | TRANSDERMAL | 12 refills | Status: AC

## 2022-10-24 NOTE — Telephone Encounter (Signed)
Patient is requesting a refill of the following medications: Requested Prescriptions   Pending Prescriptions Disp Refills   scopolamine (TRANSDERM-SCOP) 1 MG/3DAYS 10 patch 12    Sig: Place 1 patch (1.5 mg total) onto the skin every 3 (three) days.    Date of patient request: 10/24/2022 Last office visit: 07/12/2022 Date of last refill: 07/12/22 Last refill amount: 10x12 Follow up time period per chart: 1 year

## 2022-11-04 ENCOUNTER — Other Ambulatory Visit: Payer: Self-pay | Admitting: Family Medicine

## 2023-04-19 ENCOUNTER — Encounter (INDEPENDENT_AMBULATORY_CARE_PROVIDER_SITE_OTHER): Payer: Self-pay

## 2023-05-22 DIAGNOSIS — B351 Tinea unguium: Secondary | ICD-10-CM | POA: Diagnosis not present

## 2023-05-29 ENCOUNTER — Encounter: Payer: BC Managed Care – PPO | Admitting: Family Medicine

## 2023-08-31 ENCOUNTER — Encounter: Payer: BC Managed Care – PPO | Admitting: Family Medicine
